# Patient Record
Sex: Male | Born: 1950 | Race: White | Hispanic: No | Marital: Married
Health system: Southern US, Community
[De-identification: ages and names within clinical notes are randomized; demographics above are authoritative.]

## PROBLEM LIST (undated history)

## (undated) DIAGNOSIS — J9621 Acute and chronic respiratory failure with hypoxia: Secondary | ICD-10-CM

## (undated) DIAGNOSIS — G5600 Carpal tunnel syndrome, unspecified upper limb: Secondary | ICD-10-CM

## (undated) DIAGNOSIS — F32A Depression, unspecified: Secondary | ICD-10-CM

## (undated) DIAGNOSIS — G40909 Epilepsy, unspecified, not intractable, without status epilepticus: Secondary | ICD-10-CM

## (undated) DIAGNOSIS — H25013 Cortical age-related cataract, bilateral: Secondary | ICD-10-CM

## (undated) DIAGNOSIS — I5022 Chronic systolic (congestive) heart failure: Secondary | ICD-10-CM

## (undated) DIAGNOSIS — E785 Hyperlipidemia, unspecified: Secondary | ICD-10-CM

## (undated) DIAGNOSIS — E039 Hypothyroidism, unspecified: Secondary | ICD-10-CM

## (undated) DIAGNOSIS — M1990 Unspecified osteoarthritis, unspecified site: Secondary | ICD-10-CM

## (undated) DIAGNOSIS — M109 Gout, unspecified: Secondary | ICD-10-CM

## (undated) DIAGNOSIS — E119 Type 2 diabetes mellitus without complications: Secondary | ICD-10-CM

## (undated) DIAGNOSIS — U071 COVID-19: Secondary | ICD-10-CM

## (undated) DIAGNOSIS — F329 Major depressive disorder, single episode, unspecified: Secondary | ICD-10-CM

## (undated) DIAGNOSIS — E1142 Type 2 diabetes mellitus with diabetic polyneuropathy: Secondary | ICD-10-CM

## (undated) DIAGNOSIS — E669 Obesity, unspecified: Secondary | ICD-10-CM

## (undated) DIAGNOSIS — I6389 Other cerebral infarction: Secondary | ICD-10-CM

## (undated) DIAGNOSIS — I219 Acute myocardial infarction, unspecified: Secondary | ICD-10-CM

## (undated) DIAGNOSIS — M722 Plantar fascial fibromatosis: Secondary | ICD-10-CM

## (undated) HISTORY — PX: CORONARY ARTERY BYPASS GRAFT: SHX141

## (undated) HISTORY — PX: COLON SURGERY: SHX602

---

## 2017-08-18 ENCOUNTER — Other Ambulatory Visit (HOSPITAL_COMMUNITY): Payer: Self-pay

## 2017-08-18 ENCOUNTER — Inpatient Hospital Stay
Admission: AD | Admit: 2017-08-18 | Discharge: 2017-09-25 | Disposition: A | Discharge status: Skilled Nursing Facility | Payer: Self-pay | Source: Ambulatory Visit | Attending: Internal Medicine | Admitting: Internal Medicine

## 2017-08-18 DIAGNOSIS — J189 Pneumonia, unspecified organism: Secondary | ICD-10-CM

## 2017-08-18 DIAGNOSIS — J9690 Respiratory failure, unspecified, unspecified whether with hypoxia or hypercapnia: Secondary | ICD-10-CM

## 2017-08-18 DIAGNOSIS — R509 Fever, unspecified: Secondary | ICD-10-CM

## 2017-08-18 DIAGNOSIS — Z431 Encounter for attention to gastrostomy: Secondary | ICD-10-CM

## 2017-08-18 DIAGNOSIS — Z4659 Encounter for fitting and adjustment of other gastrointestinal appliance and device: Secondary | ICD-10-CM

## 2017-08-18 HISTORY — DX: Carpal tunnel syndrome, unspecified upper limb: G56.00

## 2017-08-18 HISTORY — DX: Plantar fascial fibromatosis: M72.2

## 2017-08-18 HISTORY — DX: Gout, unspecified: M10.9

## 2017-08-18 HISTORY — DX: Hypothyroidism, unspecified: E03.9

## 2017-08-18 HISTORY — DX: Cortical age-related cataract, bilateral: H25.013

## 2017-08-18 HISTORY — DX: Unspecified osteoarthritis, unspecified site: M19.90

## 2017-08-18 HISTORY — DX: Type 2 diabetes mellitus without complications: E11.9

## 2017-08-18 HISTORY — DX: Type 2 diabetes mellitus with diabetic polyneuropathy: E11.42

## 2017-08-18 HISTORY — DX: Major depressive disorder, single episode, unspecified: F32.9

## 2017-08-18 HISTORY — DX: Depression, unspecified: F32.A

## 2017-08-18 HISTORY — DX: Obesity, unspecified: E66.9

## 2017-08-18 HISTORY — DX: Hyperlipidemia, unspecified: E78.5

## 2017-08-18 LAB — PROTIME-INR
INR: 2.77
Prothrombin Time: 29.1 seconds — ABNORMAL HIGH (ref 11.4–15.2)

## 2017-08-19 LAB — COMPREHENSIVE METABOLIC PANEL
ALK PHOS: 102 U/L (ref 38–126)
ALT: 24 U/L (ref 17–63)
AST: 32 U/L (ref 15–41)
Albumin: 1.4 g/dL — ABNORMAL LOW (ref 3.5–5.0)
Anion gap: 7 (ref 5–15)
BUN: 38 mg/dL — ABNORMAL HIGH (ref 6–20)
CALCIUM: 7.6 mg/dL — AB (ref 8.9–10.3)
CO2: 26 mmol/L (ref 22–32)
CREATININE: 1.03 mg/dL (ref 0.61–1.24)
Chloride: 111 mmol/L (ref 101–111)
GFR calc non Af Amer: >60 mL/min (ref >60)
GLUCOSE: 142 mg/dL — AB (ref 65–99)
Potassium: 3.8 mmol/L (ref 3.5–5.1)
SODIUM: 144 mmol/L (ref 135–145)
Total Bilirubin: 1.1 mg/dL (ref 0.3–1.2)
Total Protein: 5.3 g/dL — ABNORMAL LOW (ref 6.5–8.1)

## 2017-08-19 LAB — CBC WITH DIFFERENTIAL/PLATELET
BASOS PCT: 0 %
Basophils Absolute: 0 10*3/uL (ref 0.0–0.1)
EOS ABS: 0.1 10*3/uL (ref 0.0–0.7)
Eosinophils Relative: 1 %
HCT: 27.5 % — ABNORMAL LOW (ref 39.0–52.0)
Hemoglobin: 8.3 g/dL — ABNORMAL LOW (ref 13.0–17.0)
Lymphocytes Relative: 23 %
Lymphs Abs: 1.6 10*3/uL (ref 0.7–4.0)
MCH: 28.2 pg (ref 26.0–34.0)
MCHC: 30.2 g/dL (ref 30.0–36.0)
MCV: 93.5 fL (ref 78.0–100.0)
MONO ABS: 0.5 10*3/uL (ref 0.1–1.0)
MONOS PCT: 8 %
NEUTROS PCT: 68 %
Neutro Abs: 4.7 10*3/uL (ref 1.7–7.7)
PLATELETS: 324 10*3/uL (ref 150–400)
RBC: 2.94 MIL/uL — ABNORMAL LOW (ref 4.22–5.81)
RDW: 18.4 % — ABNORMAL HIGH (ref 11.5–15.5)
WBC: 7 10*3/uL (ref 4.0–10.5)

## 2017-08-19 LAB — PROTIME-INR
INR: 3.35
PROTHROMBIN TIME: 33.7 s — AB (ref 11.4–15.2)

## 2017-08-19 LAB — HEMOGLOBIN A1C
Hgb A1c MFr Bld: 5.5 % (ref 4.8–5.6)
Mean Plasma Glucose: 111.15 mg/dL

## 2017-08-20 ENCOUNTER — Other Ambulatory Visit (HOSPITAL_COMMUNITY): Payer: Self-pay

## 2017-08-20 LAB — URINALYSIS, ROUTINE W REFLEX MICROSCOPIC
BILIRUBIN URINE: NEGATIVE
Glucose, UA: NEGATIVE mg/dL
KETONES UR: NEGATIVE mg/dL
Leukocytes, UA: NEGATIVE
Nitrite: NEGATIVE
Protein, ur: 30 mg/dL — AB
Specific Gravity, Urine: 1.02 (ref 1.005–1.030)
pH: 5 (ref 5.0–8.0)

## 2017-08-20 LAB — PROTIME-INR
INR: 2.39
Prothrombin Time: 25.9 seconds — ABNORMAL HIGH (ref 11.4–15.2)

## 2017-08-21 LAB — CBC
HEMATOCRIT: 34.9 % — AB (ref 39.0–52.0)
HEMOGLOBIN: 10.1 g/dL — AB (ref 13.0–17.0)
MCH: 28.4 pg (ref 26.0–34.0)
MCHC: 28.9 g/dL — ABNORMAL LOW (ref 30.0–36.0)
MCV: 98 fL (ref 78.0–100.0)
Platelets: 275 10*3/uL (ref 150–400)
RBC: 3.56 MIL/uL — AB (ref 4.22–5.81)
RDW: 16.8 % — ABNORMAL HIGH (ref 11.5–15.5)
WBC: 17.1 10*3/uL — ABNORMAL HIGH (ref 4.0–10.5)

## 2017-08-21 LAB — BASIC METABOLIC PANEL
Anion gap: 9 (ref 5–15)
BUN: 43 mg/dL — AB (ref 6–20)
CHLORIDE: 105 mmol/L (ref 101–111)
CO2: 31 mmol/L (ref 22–32)
Calcium: 8 mg/dL — ABNORMAL LOW (ref 8.9–10.3)
Creatinine, Ser: 0.93 mg/dL (ref 0.61–1.24)
GFR calc Af Amer: >60 mL/min (ref >60)
GFR calc non Af Amer: >60 mL/min (ref >60)
GLUCOSE: 106 mg/dL — AB (ref 65–99)
POTASSIUM: 4.4 mmol/L (ref 3.5–5.1)
SODIUM: 145 mmol/L (ref 135–145)

## 2017-08-21 LAB — BLOOD CULTURE ID PANEL (REFLEXED)
Acinetobacter baumannii: NOT DETECTED
CANDIDA KRUSEI: NOT DETECTED
CANDIDA TROPICALIS: NOT DETECTED
Candida albicans: NOT DETECTED
Candida glabrata: NOT DETECTED
Candida parapsilosis: NOT DETECTED
ENTEROCOCCUS SPECIES: NOT DETECTED
ESCHERICHIA COLI: NOT DETECTED
Enterobacter cloacae complex: NOT DETECTED
Enterobacteriaceae species: NOT DETECTED
Haemophilus influenzae: NOT DETECTED
Klebsiella oxytoca: NOT DETECTED
Klebsiella pneumoniae: NOT DETECTED
LISTERIA MONOCYTOGENES: NOT DETECTED
Methicillin resistance: DETECTED — AB
Neisseria meningitidis: NOT DETECTED
PROTEUS SPECIES: NOT DETECTED
Pseudomonas aeruginosa: NOT DETECTED
SERRATIA MARCESCENS: NOT DETECTED
STAPHYLOCOCCUS AUREUS BCID: NOT DETECTED
STAPHYLOCOCCUS SPECIES: DETECTED — AB
STREPTOCOCCUS AGALACTIAE: NOT DETECTED
Streptococcus pneumoniae: NOT DETECTED
Streptococcus pyogenes: NOT DETECTED
Streptococcus species: NOT DETECTED

## 2017-08-21 LAB — URINE CULTURE: Culture: >=100000 — AB

## 2017-08-21 LAB — PROTIME-INR
INR: 1.25
INR: 2.19
Prothrombin Time: 15.6 seconds — ABNORMAL HIGH (ref 11.4–15.2)
Prothrombin Time: 24.2 seconds — ABNORMAL HIGH (ref 11.4–15.2)

## 2017-08-22 LAB — VANCOMYCIN, TROUGH: Vancomycin Tr: 37 ug/mL (ref 15–20)

## 2017-08-22 LAB — CULTURE, BLOOD (ROUTINE X 2)

## 2017-08-22 LAB — PROTIME-INR
INR: 2.2
PROTHROMBIN TIME: 24.3 s — AB (ref 11.4–15.2)

## 2017-08-23 LAB — VANCOMYCIN, TROUGH: VANCOMYCIN TR: 20 ug/mL (ref 15–20)

## 2017-08-23 LAB — PROTIME-INR
INR: 2.22
Prothrombin Time: 24.4 seconds — ABNORMAL HIGH (ref 11.4–15.2)

## 2017-08-24 LAB — PROTIME-INR
INR: 2.44
Prothrombin Time: 26.3 seconds — ABNORMAL HIGH (ref 11.4–15.2)

## 2017-08-25 LAB — C DIFFICILE QUICK SCREEN W PCR REFLEX
C DIFFICLE (CDIFF) ANTIGEN: NEGATIVE
C Diff interpretation: NOT DETECTED
C Diff toxin: NEGATIVE

## 2017-08-25 LAB — PROTIME-INR
INR: 2.66
PROTHROMBIN TIME: 28.1 s — AB (ref 11.4–15.2)

## 2017-08-25 LAB — CULTURE, BLOOD (ROUTINE X 2)
Culture: NO GROWTH
Special Requests: ADEQUATE

## 2017-08-25 LAB — VANCOMYCIN, TROUGH: VANCOMYCIN TR: 40 ug/mL — AB (ref 15–20)

## 2017-08-26 LAB — VANCOMYCIN, TROUGH: VANCOMYCIN TR: 21 ug/mL — AB (ref 15–20)

## 2017-08-26 LAB — PROTIME-INR
INR: 2.86
Prothrombin Time: 29.8 seconds — ABNORMAL HIGH (ref 11.4–15.2)

## 2017-08-27 ENCOUNTER — Other Ambulatory Visit (HOSPITAL_COMMUNITY): Payer: Self-pay

## 2017-08-27 LAB — PROTIME-INR
INR: 2.67
PROTHROMBIN TIME: 28.2 s — AB (ref 11.4–15.2)

## 2017-08-28 LAB — VANCOMYCIN, TROUGH: Vancomycin Tr: 34 ug/mL (ref 15–20)

## 2017-08-28 LAB — PROTIME-INR
INR: 2.66
PROTHROMBIN TIME: 28.2 s — AB (ref 11.4–15.2)

## 2017-08-29 LAB — PROTIME-INR
INR: 3.11
PROTHROMBIN TIME: 31.8 s — AB (ref 11.4–15.2)

## 2017-08-30 LAB — PROTIME-INR
INR: 2.9
Prothrombin Time: 30.1 seconds — ABNORMAL HIGH (ref 11.4–15.2)

## 2017-08-31 LAB — CBC
HCT: 26.9 % — ABNORMAL LOW (ref 39.0–52.0)
Hemoglobin: 7.2 g/dL — ABNORMAL LOW (ref 13.0–17.0)
MCH: 25.6 pg — ABNORMAL LOW (ref 26.0–34.0)
MCHC: 26.8 g/dL — AB (ref 30.0–36.0)
MCV: 95.7 fL (ref 78.0–100.0)
Platelets: 557 10*3/uL — ABNORMAL HIGH (ref 150–400)
RBC: 2.81 MIL/uL — ABNORMAL LOW (ref 4.22–5.81)
RDW: 18.1 % — AB (ref 11.5–15.5)
WBC: 8.9 10*3/uL (ref 4.0–10.5)

## 2017-08-31 LAB — BASIC METABOLIC PANEL
ANION GAP: 7 (ref 5–15)
BUN: 30 mg/dL — AB (ref 6–20)
CALCIUM: 7.9 mg/dL — AB (ref 8.9–10.3)
CO2: 27 mmol/L (ref 22–32)
Chloride: 125 mmol/L — ABNORMAL HIGH (ref 101–111)
Creatinine, Ser: 0.98 mg/dL (ref 0.61–1.24)
GFR calc Af Amer: >60 mL/min (ref >60)
GLUCOSE: 109 mg/dL — AB (ref 65–99)
Potassium: 3.7 mmol/L (ref 3.5–5.1)
SODIUM: 159 mmol/L — AB (ref 135–145)

## 2017-08-31 LAB — PROTIME-INR
INR: 2.58
PROTHROMBIN TIME: 27.4 s — AB (ref 11.4–15.2)

## 2017-09-01 LAB — BASIC METABOLIC PANEL
Anion gap: 5 (ref 5–15)
BUN: 26 mg/dL — AB (ref 6–20)
CHLORIDE: 124 mmol/L — AB (ref 101–111)
CO2: 27 mmol/L (ref 22–32)
Calcium: 7.8 mg/dL — ABNORMAL LOW (ref 8.9–10.3)
Creatinine, Ser: 0.9 mg/dL (ref 0.61–1.24)
GFR calc non Af Amer: >60 mL/min (ref >60)
Glucose, Bld: 174 mg/dL — ABNORMAL HIGH (ref 65–99)
POTASSIUM: 3.9 mmol/L (ref 3.5–5.1)
SODIUM: 156 mmol/L — AB (ref 135–145)

## 2017-09-01 LAB — PROTIME-INR
INR: 2.36
Prothrombin Time: 25.6 seconds — ABNORMAL HIGH (ref 11.4–15.2)

## 2017-09-02 LAB — PROTIME-INR
INR: 2.75
Prothrombin Time: 28.9 seconds — ABNORMAL HIGH (ref 11.4–15.2)

## 2017-09-03 LAB — PROTIME-INR
INR: 2.88
Prothrombin Time: 29.9 seconds — ABNORMAL HIGH (ref 11.4–15.2)

## 2017-09-04 LAB — URINALYSIS, ROUTINE W REFLEX MICROSCOPIC
Bilirubin Urine: NEGATIVE
GLUCOSE, UA: NEGATIVE mg/dL
Hgb urine dipstick: NEGATIVE
Ketones, ur: NEGATIVE mg/dL
LEUKOCYTES UA: NEGATIVE
Nitrite: NEGATIVE
PH: 5 (ref 5.0–8.0)
Protein, ur: NEGATIVE mg/dL
Specific Gravity, Urine: 1.017 (ref 1.005–1.030)

## 2017-09-04 LAB — BASIC METABOLIC PANEL
ANION GAP: 8 (ref 5–15)
BUN: 23 mg/dL — ABNORMAL HIGH (ref 6–20)
CALCIUM: 8 mg/dL — AB (ref 8.9–10.3)
CO2: 27 mmol/L (ref 22–32)
Chloride: 110 mmol/L (ref 101–111)
Creatinine, Ser: 0.85 mg/dL (ref 0.61–1.24)
GFR calc Af Amer: >60 mL/min (ref >60)
GFR calc non Af Amer: >60 mL/min (ref >60)
GLUCOSE: 134 mg/dL — AB (ref 65–99)
POTASSIUM: 3.6 mmol/L (ref 3.5–5.1)
Sodium: 145 mmol/L (ref 135–145)

## 2017-09-04 LAB — PROTIME-INR
INR: 2.39
PROTHROMBIN TIME: 25.9 s — AB (ref 11.4–15.2)

## 2017-09-05 LAB — PROTIME-INR
INR: 2.32
Prothrombin Time: 25.3 seconds — ABNORMAL HIGH (ref 11.4–15.2)

## 2017-09-05 LAB — URINE CULTURE
CULTURE: NO GROWTH
Special Requests: NORMAL

## 2017-09-06 LAB — PROTIME-INR
INR: 2.14
Prothrombin Time: 23.7 seconds — ABNORMAL HIGH (ref 11.4–15.2)

## 2017-09-07 ENCOUNTER — Other Ambulatory Visit (HOSPITAL_COMMUNITY): Payer: Self-pay

## 2017-09-07 LAB — CULTURE, RESPIRATORY: CULTURE: NORMAL

## 2017-09-07 LAB — PROTIME-INR
INR: 2.12
PROTHROMBIN TIME: 23.6 s — AB (ref 11.4–15.2)

## 2017-09-07 LAB — CULTURE, RESPIRATORY W GRAM STAIN

## 2017-09-08 LAB — BASIC METABOLIC PANEL
Anion gap: 5 (ref 5–15)
BUN: 17 mg/dL (ref 6–20)
CHLORIDE: 105 mmol/L (ref 101–111)
CO2: 29 mmol/L (ref 22–32)
CREATININE: 0.72 mg/dL (ref 0.61–1.24)
Calcium: 8.3 mg/dL — ABNORMAL LOW (ref 8.9–10.3)
GLUCOSE: 179 mg/dL — AB (ref 65–99)
Potassium: 3.7 mmol/L (ref 3.5–5.1)
Sodium: 139 mmol/L (ref 135–145)

## 2017-09-08 LAB — CBC
HEMATOCRIT: 27.1 % — AB (ref 39.0–52.0)
HEMOGLOBIN: 7.4 g/dL — AB (ref 13.0–17.0)
MCH: 25 pg — AB (ref 26.0–34.0)
MCHC: 27.3 g/dL — AB (ref 30.0–36.0)
MCV: 91.6 fL (ref 78.0–100.0)
Platelets: 595 10*3/uL — ABNORMAL HIGH (ref 150–400)
RBC: 2.96 MIL/uL — ABNORMAL LOW (ref 4.22–5.81)
RDW: 17.8 % — AB (ref 11.5–15.5)
WBC: 13.2 10*3/uL — ABNORMAL HIGH (ref 4.0–10.5)

## 2017-09-08 LAB — PROTIME-INR
INR: 1.89
Prothrombin Time: 21.5 seconds — ABNORMAL HIGH (ref 11.4–15.2)

## 2017-09-09 LAB — CULTURE, BLOOD (ROUTINE X 2)
Culture: NO GROWTH
Culture: NO GROWTH
Special Requests: ADEQUATE
Special Requests: ADEQUATE

## 2017-09-09 LAB — PROTIME-INR
INR: 1.97
Prothrombin Time: 22.2 seconds — ABNORMAL HIGH (ref 11.4–15.2)

## 2017-09-10 ENCOUNTER — Other Ambulatory Visit (HOSPITAL_COMMUNITY): Payer: Self-pay

## 2017-09-10 LAB — BASIC METABOLIC PANEL
ANION GAP: 6 (ref 5–15)
BUN: 14 mg/dL (ref 6–20)
CALCIUM: 8 mg/dL — AB (ref 8.9–10.3)
CO2: 26 mmol/L (ref 22–32)
CREATININE: 0.74 mg/dL (ref 0.61–1.24)
Chloride: 103 mmol/L (ref 101–111)
Glucose, Bld: 225 mg/dL — ABNORMAL HIGH (ref 65–99)
Potassium: 4.7 mmol/L (ref 3.5–5.1)
Sodium: 135 mmol/L (ref 135–145)

## 2017-09-10 LAB — CBC
HCT: 29.1 % — ABNORMAL LOW (ref 39.0–52.0)
HEMOGLOBIN: 7.9 g/dL — AB (ref 13.0–17.0)
MCH: 25.6 pg — AB (ref 26.0–34.0)
MCHC: 27.1 g/dL — AB (ref 30.0–36.0)
MCV: 94.2 fL (ref 78.0–100.0)
PLATELETS: 487 10*3/uL — AB (ref 150–400)
RBC: 3.09 MIL/uL — AB (ref 4.22–5.81)
RDW: 18.2 % — AB (ref 11.5–15.5)
WBC: 9.4 10*3/uL (ref 4.0–10.5)

## 2017-09-11 LAB — BLOOD GAS, ARTERIAL
ACID-BASE EXCESS: 2.4 mmol/L — AB (ref 0.0–2.0)
Bicarbonate: 29.6 mmol/L — ABNORMAL HIGH (ref 20.0–28.0)
O2 Content: 2 L/min
O2 Saturation: 82.1 %
PCO2 ART: 77.2 mmHg — AB (ref 32.0–48.0)
PH ART: 7.209 — AB (ref 7.350–7.450)
PO2 ART: 56.1 mmHg — AB (ref 83.0–108.0)
Patient temperature: 98.6

## 2017-09-11 LAB — PROTIME-INR
INR: 1.78
PROTHROMBIN TIME: 20.6 s — AB (ref 11.4–15.2)

## 2017-09-12 LAB — PROTIME-INR
INR: 1.77
PROTHROMBIN TIME: 20.5 s — AB (ref 11.4–15.2)

## 2017-09-13 LAB — PROTIME-INR
INR: 1.74
Prothrombin Time: 20.2 seconds — ABNORMAL HIGH (ref 11.4–15.2)

## 2017-09-14 LAB — BASIC METABOLIC PANEL
Anion gap: 11 (ref 5–15)
BUN: 34 mg/dL — AB (ref 6–20)
CO2: 34 mmol/L — AB (ref 22–32)
Calcium: 8 mg/dL — ABNORMAL LOW (ref 8.9–10.3)
Chloride: 99 mmol/L — ABNORMAL LOW (ref 101–111)
Creatinine, Ser: 0.78 mg/dL (ref 0.61–1.24)
GFR calc non Af Amer: >60 mL/min (ref >60)
Glucose, Bld: 60 mg/dL — ABNORMAL LOW (ref 65–99)
POTASSIUM: 3.6 mmol/L (ref 3.5–5.1)
SODIUM: 144 mmol/L (ref 135–145)

## 2017-09-14 LAB — PROTIME-INR
INR: 1.87
PROTHROMBIN TIME: 21.3 s — AB (ref 11.4–15.2)

## 2017-09-14 LAB — CBC
HEMATOCRIT: 32.9 % — AB (ref 39.0–52.0)
Hemoglobin: 8.9 g/dL — ABNORMAL LOW (ref 13.0–17.0)
MCH: 25.4 pg — ABNORMAL LOW (ref 26.0–34.0)
MCHC: 27.1 g/dL — AB (ref 30.0–36.0)
MCV: 94 fL (ref 78.0–100.0)
PLATELETS: 519 10*3/uL — AB (ref 150–400)
RBC: 3.5 MIL/uL — ABNORMAL LOW (ref 4.22–5.81)
RDW: 20.5 % — AB (ref 11.5–15.5)
WBC: 9.9 10*3/uL (ref 4.0–10.5)

## 2017-09-14 LAB — MAGNESIUM: MAGNESIUM: 2 mg/dL (ref 1.7–2.4)

## 2017-09-15 LAB — PROTIME-INR
INR: 2.13
PROTHROMBIN TIME: 23.7 s — AB (ref 11.4–15.2)

## 2017-09-16 LAB — PROTIME-INR
INR: 2.3
Prothrombin Time: 25.1 seconds — ABNORMAL HIGH (ref 11.4–15.2)

## 2017-09-17 LAB — PROTIME-INR
INR: 2.06
PROTHROMBIN TIME: 23.1 s — AB (ref 11.4–15.2)

## 2017-09-18 LAB — PROTIME-INR
INR: 2.63
Prothrombin Time: 27.9 seconds — ABNORMAL HIGH (ref 11.4–15.2)

## 2017-09-19 LAB — PROTIME-INR
INR: 2.72
Prothrombin Time: 28.7 seconds — ABNORMAL HIGH (ref 11.4–15.2)

## 2017-09-20 LAB — PROTIME-INR
INR: 2.8
PROTHROMBIN TIME: 29.3 s — AB (ref 11.4–15.2)

## 2017-09-21 ENCOUNTER — Encounter: Payer: Self-pay | Admitting: Physical Medicine and Rehabilitation

## 2017-09-21 LAB — PROTIME-INR
INR: 2.21
Prothrombin Time: 24.3 seconds — ABNORMAL HIGH (ref 11.4–15.2)

## 2017-09-22 LAB — PROTIME-INR
INR: 1.81
Prothrombin Time: 20.9 seconds — ABNORMAL HIGH (ref 11.4–15.2)

## 2017-09-23 LAB — PROTIME-INR
INR: 1.82
Prothrombin Time: 20.9 seconds — ABNORMAL HIGH (ref 11.4–15.2)

## 2017-09-25 LAB — BASIC METABOLIC PANEL
ANION GAP: 9 (ref 5–15)
BUN: 19 mg/dL (ref 6–20)
CALCIUM: 7.8 mg/dL — AB (ref 8.9–10.3)
CO2: 32 mmol/L (ref 22–32)
Chloride: 97 mmol/L — ABNORMAL LOW (ref 101–111)
Creatinine, Ser: 0.84 mg/dL (ref 0.61–1.24)
Glucose, Bld: 257 mg/dL — ABNORMAL HIGH (ref 65–99)
Potassium: 3.5 mmol/L (ref 3.5–5.1)
Sodium: 138 mmol/L (ref 135–145)

## 2017-09-25 LAB — PROTIME-INR
INR: 1.93
PROTHROMBIN TIME: 21.9 s — AB (ref 11.4–15.2)

## 2017-09-25 LAB — CBC
HCT: 32.8 % — ABNORMAL LOW (ref 39.0–52.0)
HEMOGLOBIN: 9.4 g/dL — AB (ref 13.0–17.0)
MCH: 26.7 pg (ref 26.0–34.0)
MCHC: 28.7 g/dL — ABNORMAL LOW (ref 30.0–36.0)
MCV: 93.2 fL (ref 78.0–100.0)
PLATELETS: 241 10*3/uL (ref 150–400)
RBC: 3.52 MIL/uL — AB (ref 4.22–5.81)
RDW: 20.6 % — ABNORMAL HIGH (ref 11.5–15.5)
WBC: 9.4 10*3/uL (ref 4.0–10.5)

## 2018-04-10 ENCOUNTER — Encounter (HOSPITAL_BASED_OUTPATIENT_CLINIC_OR_DEPARTMENT_OTHER): Payer: Self-pay

## 2018-04-10 ENCOUNTER — Emergency Department (HOSPITAL_BASED_OUTPATIENT_CLINIC_OR_DEPARTMENT_OTHER): Payer: Medicare HMO

## 2018-04-10 ENCOUNTER — Other Ambulatory Visit: Payer: Self-pay

## 2018-04-10 ENCOUNTER — Emergency Department (HOSPITAL_BASED_OUTPATIENT_CLINIC_OR_DEPARTMENT_OTHER)
Admission: EM | Admit: 2018-04-10 | Discharge: 2018-04-10 | Disposition: A | Discharge status: Home / Self Care | Payer: Medicare HMO | Attending: Emergency Medicine | Admitting: Emergency Medicine

## 2018-04-10 DIAGNOSIS — M545 Low back pain, unspecified: Secondary | ICD-10-CM

## 2018-04-10 DIAGNOSIS — Y92410 Unspecified street and highway as the place of occurrence of the external cause: Secondary | ICD-10-CM | POA: Diagnosis not present

## 2018-04-10 DIAGNOSIS — Y998 Other external cause status: Secondary | ICD-10-CM | POA: Insufficient documentation

## 2018-04-10 DIAGNOSIS — Z7902 Long term (current) use of antithrombotics/antiplatelets: Secondary | ICD-10-CM | POA: Insufficient documentation

## 2018-04-10 DIAGNOSIS — I252 Old myocardial infarction: Secondary | ICD-10-CM | POA: Insufficient documentation

## 2018-04-10 DIAGNOSIS — E119 Type 2 diabetes mellitus without complications: Secondary | ICD-10-CM | POA: Diagnosis not present

## 2018-04-10 DIAGNOSIS — Z79899 Other long term (current) drug therapy: Secondary | ICD-10-CM | POA: Insufficient documentation

## 2018-04-10 DIAGNOSIS — F329 Major depressive disorder, single episode, unspecified: Secondary | ICD-10-CM | POA: Diagnosis not present

## 2018-04-10 DIAGNOSIS — E039 Hypothyroidism, unspecified: Secondary | ICD-10-CM | POA: Diagnosis not present

## 2018-04-10 DIAGNOSIS — Z951 Presence of aortocoronary bypass graft: Secondary | ICD-10-CM | POA: Insufficient documentation

## 2018-04-10 DIAGNOSIS — S51811A Laceration without foreign body of right forearm, initial encounter: Secondary | ICD-10-CM

## 2018-04-10 DIAGNOSIS — Z7984 Long term (current) use of oral hypoglycemic drugs: Secondary | ICD-10-CM | POA: Diagnosis not present

## 2018-04-10 DIAGNOSIS — S59911A Unspecified injury of right forearm, initial encounter: Secondary | ICD-10-CM | POA: Diagnosis present

## 2018-04-10 DIAGNOSIS — Y9389 Activity, other specified: Secondary | ICD-10-CM | POA: Insufficient documentation

## 2018-04-10 HISTORY — DX: Acute myocardial infarction, unspecified: I21.9

## 2018-04-10 LAB — CBG MONITORING, ED: GLUCOSE-CAPILLARY: 146 mg/dL — AB (ref 65–99)

## 2018-04-10 MED ORDER — LIDOCAINE 5 % EX PTCH: 1.0000 | MEDICATED_PATCH | CUTANEOUS | 0 refills | Status: AC

## 2018-04-10 MED ORDER — METHOCARBAMOL 500 MG PO TABS: 500.0000 mg | ORAL_TABLET | Freq: Two times a day (BID) | ORAL | 0 refills | Status: DC

## 2018-04-10 NOTE — ED Notes (Signed)
ED Provider at bedside. 
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
## 2018-04-10 NOTE — ED Notes (Signed)
Pt denies LOC/hitting his head. Pt on blood thinner.

## 2018-04-10 NOTE — Discharge Instructions (Addendum)
Expect your soreness to increase over the next 2-3 days. Take it easy, but do not lay around too much as this may make any stiffness worse.  Tylenol: May take Tylenol for pain.  Your daily total maximum amount of tylenol from all sources should be limited to /day for persons without liver problems, or /day for those with liver problems. Lidocaine patches: These are available via either prescription or over-the-counter. The over-the-counter option may be more economical one and are likely just as effective. There are multiple over-the-counter brands, such as Salonpas. Exercises: Be sure to perform the attached exercises starting with three times a week and working up to performing them daily. This is an essential part of preventing long term problems.   Follow up with a primary care provider for any future management of these complaints.   Wound Care You may remove the bandage after 24 hours. Clean the wound and surrounding area gently with tap water and mild soap. Rinse well and blot dry. Do not scrub the wound. You may shower, but avoid submerging the wound, such as with a bath or swimming. Clean the wound daily to prevent infection. Do not use cleaners such as hydrogen peroxide or alcohol.   Scar reduction: Application of a topical antibiotic ointment, such as Neosporin, after the wound has begun to close and heal well can decrease scab formation and reduce scarring. After the wound has healed, application of ointments such as Aquaphor can also reduce scar formation.  The key to scar reduction is keeping the skin well hydrated and supple. Drinking plenty of water throughout the day (At least eight 8oz glasses of water a day) is essential to staying well hydrated.  Sun exposure: Keep the wound out of the sun. After the wound has healed, continue to protect it from the sun by wearing protective clothing or applying sunscreen.  Pain: You may use Tylenol for pain.  Return to the ED  sooner should signs of infection arise, such as spreading redness, puffiness/swelling, pus draining from the wound, severe increase in pain, fever over 100.61F, or any other major issues.
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
## 2020-02-08 ENCOUNTER — Inpatient Hospital Stay
Admission: RE | Admit: 2020-02-08 | Discharge: 2020-03-21 | Disposition: E | Payer: Medicare HMO | Source: Other Acute Inpatient Hospital | Attending: Internal Medicine | Admitting: Internal Medicine

## 2020-02-08 DIAGNOSIS — I6389 Other cerebral infarction: Secondary | ICD-10-CM | POA: Diagnosis present

## 2020-02-08 DIAGNOSIS — J9690 Respiratory failure, unspecified, unspecified whether with hypoxia or hypercapnia: Secondary | ICD-10-CM

## 2020-02-08 DIAGNOSIS — Z452 Encounter for adjustment and management of vascular access device: Secondary | ICD-10-CM

## 2020-02-08 DIAGNOSIS — I5022 Chronic systolic (congestive) heart failure: Secondary | ICD-10-CM | POA: Diagnosis present

## 2020-02-08 DIAGNOSIS — Z931 Gastrostomy status: Secondary | ICD-10-CM

## 2020-02-08 DIAGNOSIS — J9621 Acute and chronic respiratory failure with hypoxia: Secondary | ICD-10-CM | POA: Diagnosis present

## 2020-02-08 DIAGNOSIS — G40909 Epilepsy, unspecified, not intractable, without status epilepticus: Secondary | ICD-10-CM

## 2020-02-08 DIAGNOSIS — U071 COVID-19: Secondary | ICD-10-CM | POA: Diagnosis present

## 2020-02-08 HISTORY — DX: Other cerebral infarction: I63.89

## 2020-02-08 HISTORY — DX: COVID-19: U07.1

## 2020-02-08 HISTORY — DX: Epilepsy, unspecified, not intractable, without status epilepticus: G40.909

## 2020-02-08 HISTORY — DX: Acute and chronic respiratory failure with hypoxia: J96.21

## 2020-02-08 HISTORY — DX: Chronic systolic (congestive) heart failure: I50.22

## 2020-02-08 LAB — BLOOD GAS, ARTERIAL
Acid-Base Excess: 12.3 mmol/L — ABNORMAL HIGH (ref 0.0–2.0)
Bicarbonate: 37.3 mmol/L — ABNORMAL HIGH (ref 20.0–28.0)
Drawn by: 243969
FIO2: 28
O2 Saturation: 89 %
Patient temperature: 37
pCO2 arterial: 58.5 mmHg — ABNORMAL HIGH (ref 32.0–48.0)
pH, Arterial: 7.421 (ref 7.350–7.450)
pO2, Arterial: 59.7 mmHg — ABNORMAL LOW (ref 83.0–108.0)

## 2020-02-09 ENCOUNTER — Other Ambulatory Visit (HOSPITAL_COMMUNITY): Payer: Medicare HMO

## 2020-02-09 LAB — COMPREHENSIVE METABOLIC PANEL
ALT: 35 U/L (ref 0–44)
AST: 51 U/L — ABNORMAL HIGH (ref 15–41)
Albumin: 2 g/dL — ABNORMAL LOW (ref 3.5–5.0)
Alkaline Phosphatase: 150 U/L — ABNORMAL HIGH (ref 38–126)
Anion gap: 13 (ref 5–15)
BUN: 21 mg/dL (ref 8–23)
CO2: 34 mmol/L — ABNORMAL HIGH (ref 22–32)
Calcium: 9.4 mg/dL (ref 8.9–10.3)
Chloride: 96 mmol/L — ABNORMAL LOW (ref 98–111)
Creatinine, Ser: 0.8 mg/dL (ref 0.61–1.24)
GFR calc Af Amer: >60 mL/min (ref >60)
GFR calc non Af Amer: >60 mL/min (ref >60)
Glucose, Bld: 143 mg/dL — ABNORMAL HIGH (ref 70–99)
Potassium: 5.3 mmol/L — ABNORMAL HIGH (ref 3.5–5.1)
Sodium: 143 mmol/L (ref 135–145)
Total Bilirubin: 1.3 mg/dL — ABNORMAL HIGH (ref 0.3–1.2)
Total Protein: 7.1 g/dL (ref 6.5–8.1)

## 2020-02-09 LAB — CBC WITH DIFFERENTIAL/PLATELET
Abs Immature Granulocytes: 0.03 10*3/uL (ref 0.00–0.07)
Basophils Absolute: 0.1 10*3/uL (ref 0.0–0.1)
Basophils Relative: 0 %
Eosinophils Absolute: 0.2 10*3/uL (ref 0.0–0.5)
Eosinophils Relative: 1 %
HCT: 34.4 % — ABNORMAL LOW (ref 39.0–52.0)
Hemoglobin: 10.7 g/dL — ABNORMAL LOW (ref 13.0–17.0)
Immature Granulocytes: 0 %
Lymphocytes Relative: 11 %
Lymphs Abs: 1.2 10*3/uL (ref 0.7–4.0)
MCH: 30.4 pg (ref 26.0–34.0)
MCHC: 31.1 g/dL (ref 30.0–36.0)
MCV: 97.7 fL (ref 80.0–100.0)
Monocytes Absolute: 1 10*3/uL (ref 0.1–1.0)
Monocytes Relative: 9 %
Neutro Abs: 8.7 10*3/uL — ABNORMAL HIGH (ref 1.7–7.7)
Neutrophils Relative %: 79 %
Platelets: 440 10*3/uL — ABNORMAL HIGH (ref 150–400)
RBC: 3.52 MIL/uL — ABNORMAL LOW (ref 4.22–5.81)
RDW: 16.9 % — ABNORMAL HIGH (ref 11.5–15.5)
WBC: 11.1 10*3/uL — ABNORMAL HIGH (ref 4.0–10.5)
nRBC: 0 % (ref 0.0–0.2)

## 2020-02-09 LAB — TSH: TSH: 5.186 u[IU]/mL — ABNORMAL HIGH (ref 0.350–4.500)

## 2020-02-09 MED ORDER — IOHEXOL 350 MG/ML SOLN
40.0000 mL | Freq: Once | INTRAVENOUS | Status: AC | PRN
  Administered 2020-02-09: 40 mL

## 2020-02-10 LAB — POTASSIUM: Potassium: 4.5 mmol/L (ref 3.5–5.1)

## 2020-02-10 LAB — VANCOMYCIN, TROUGH: Vancomycin Tr: 21 ug/mL (ref 15–20)

## 2020-02-10 MED ORDER — ATORVASTATIN CALCIUM 40 MG PO TABS
80.00 | ORAL_TABLET | ORAL | Status: DC
Start: 2020-02-09 — End: 2020-02-10

## 2020-02-10 MED ORDER — CHOLECALCIFEROL 25 MCG (1000 UT) PO TABS
2000.00 | ORAL_TABLET | ORAL | Status: DC
Start: 2020-02-09 — End: 2020-02-10

## 2020-02-10 MED ORDER — GENERIC EXTERNAL MEDICATION
Status: DC
Start: ? — End: 2020-02-10

## 2020-02-10 MED ORDER — INSULIN LISPRO 100 UNIT/ML ~~LOC~~ SOLN
0.00 | SUBCUTANEOUS | Status: DC
Start: 2020-02-08 — End: 2020-02-10

## 2020-02-10 MED ORDER — FAMOTIDINE 20 MG PO TABS
20.00 | ORAL_TABLET | ORAL | Status: DC
Start: 2020-02-08 — End: 2020-02-10

## 2020-02-10 MED ORDER — GLYCOPYRROLATE 1 MG PO TABS
1.00 | ORAL_TABLET | ORAL | Status: DC
Start: ? — End: 2020-02-10

## 2020-02-10 MED ORDER — LEVOTHYROXINE SODIUM 75 MCG PO TABS
75.00 | ORAL_TABLET | ORAL | Status: DC
Start: 2020-02-09 — End: 2020-02-10

## 2020-02-10 MED ORDER — THIAMINE HCL 100 MG PO TABS
100.00 | ORAL_TABLET | ORAL | Status: DC
Start: 2020-02-09 — End: 2020-02-10

## 2020-02-10 MED ORDER — GLUCOSE 40 % PO GEL
15.00 | ORAL | Status: DC
Start: ? — End: 2020-02-10

## 2020-02-10 MED ORDER — ACETAMINOPHEN 160 MG/5ML PO SOLN
500.00 | ORAL | Status: DC
Start: ? — End: 2020-02-10

## 2020-02-10 MED ORDER — NYSTATIN 100000 UNIT/GM EX POWD
CUTANEOUS | Status: DC
Start: 2020-02-08 — End: 2020-02-10

## 2020-02-10 MED ORDER — MAGNESIUM OXIDE 400 MG PO TABS
400.00 | ORAL_TABLET | ORAL | Status: DC
Start: 2020-02-08 — End: 2020-02-10

## 2020-02-10 MED ORDER — LEVETIRACETAM 100 MG/ML PO SOLN
500.00 | ORAL | Status: DC
Start: 2020-02-08 — End: 2020-02-10

## 2020-02-10 MED ORDER — PRO-STAT PO LIQD
ORAL | Status: DC
Start: 2020-02-09 — End: 2020-02-10

## 2020-02-10 MED ORDER — ALBUTEROL SULFATE HFA 108 (90 BASE) MCG/ACT IN AERS
2.00 | INHALATION_SPRAY | RESPIRATORY_TRACT | Status: DC
Start: ? — End: 2020-02-10

## 2020-02-10 MED ORDER — POLYVINYL ALCOHOL 1.4 % OP SOLN
1.00 | OPHTHALMIC | Status: DC
Start: 2020-02-08 — End: 2020-02-10

## 2020-02-10 MED ORDER — ENOXAPARIN SODIUM 40 MG/0.4ML ~~LOC~~ SOLN
0.50 | SUBCUTANEOUS | Status: DC
Start: 2020-02-08 — End: 2020-02-10

## 2020-02-10 MED ORDER — ASPIRIN 81 MG PO CHEW
81.00 | CHEWABLE_TABLET | ORAL | Status: DC
Start: 2020-02-09 — End: 2020-02-10

## 2020-02-10 MED ORDER — CYANOCOBALAMIN 100 MCG PO TABS
100.00 | ORAL_TABLET | ORAL | Status: DC
Start: 2020-02-09 — End: 2020-02-10

## 2020-02-10 MED ORDER — QUETIAPINE FUMARATE 25 MG PO TABS
12.50 | ORAL_TABLET | ORAL | Status: DC
Start: 2020-02-08 — End: 2020-02-10

## 2020-02-10 MED ORDER — METOCLOPRAMIDE HCL 5 MG/ML IJ SOLN
5.00 | INTRAMUSCULAR | Status: DC
Start: ? — End: 2020-02-10

## 2020-02-10 MED ORDER — GABAPENTIN 250 MG/5ML PO SOLN
300.00 | ORAL | Status: DC
Start: 2020-02-08 — End: 2020-02-10

## 2020-02-10 MED ORDER — FA-PYRIDOXINE-CYANOCOBALAMIN 2.5-25-2 MG PO TABS
1.00 | ORAL_TABLET | ORAL | Status: DC
Start: 2020-02-09 — End: 2020-02-10

## 2020-02-10 MED ORDER — DEXTROSE 10 % IV SOLN
125.00 | INTRAVENOUS | Status: DC
Start: ? — End: 2020-02-10

## 2020-02-10 MED ORDER — ACETAMINOPHEN 325 MG PO TABS
650.00 | ORAL_TABLET | ORAL | Status: DC
Start: ? — End: 2020-02-10

## 2020-02-10 MED ORDER — ALLOPURINOL 300 MG PO TABS
300.00 | ORAL_TABLET | ORAL | Status: DC
Start: 2020-02-08 — End: 2020-02-10

## 2020-02-10 MED ORDER — GLUCAGON (RDNA) 1 MG IJ KIT
1.00 | PACK | INTRAMUSCULAR | Status: DC
Start: ? — End: 2020-02-10

## 2020-02-11 LAB — COMPREHENSIVE METABOLIC PANEL
ALT: 37 U/L (ref 0–44)
AST: 46 U/L — ABNORMAL HIGH (ref 15–41)
Albumin: 1.9 g/dL — ABNORMAL LOW (ref 3.5–5.0)
Alkaline Phosphatase: 136 U/L — ABNORMAL HIGH (ref 38–126)
Anion gap: 8 (ref 5–15)
BUN: 49 mg/dL — ABNORMAL HIGH (ref 8–23)
CO2: 30 mmol/L (ref 22–32)
Calcium: 8.4 mg/dL — ABNORMAL LOW (ref 8.9–10.3)
Chloride: 102 mmol/L (ref 98–111)
Creatinine, Ser: 1 mg/dL (ref 0.61–1.24)
GFR calc Af Amer: >60 mL/min (ref >60)
GFR calc non Af Amer: >60 mL/min (ref >60)
Glucose, Bld: 321 mg/dL — ABNORMAL HIGH (ref 70–99)
Potassium: 4.5 mmol/L (ref 3.5–5.1)
Sodium: 140 mmol/L (ref 135–145)
Total Bilirubin: 1.5 mg/dL — ABNORMAL HIGH (ref 0.3–1.2)
Total Protein: 6.9 g/dL (ref 6.5–8.1)

## 2020-02-12 LAB — CBC
HCT: 35.3 % — ABNORMAL LOW (ref 39.0–52.0)
Hemoglobin: 10.8 g/dL — ABNORMAL LOW (ref 13.0–17.0)
MCH: 30.2 pg (ref 26.0–34.0)
MCHC: 30.6 g/dL (ref 30.0–36.0)
MCV: 98.6 fL (ref 80.0–100.0)
Platelets: 532 10*3/uL — ABNORMAL HIGH (ref 150–400)
RBC: 3.58 MIL/uL — ABNORMAL LOW (ref 4.22–5.81)
RDW: 17 % — ABNORMAL HIGH (ref 11.5–15.5)
WBC: 18.5 10*3/uL — ABNORMAL HIGH (ref 4.0–10.5)
nRBC: 0.1 % (ref 0.0–0.2)

## 2020-02-12 LAB — BASIC METABOLIC PANEL
Anion gap: 12 (ref 5–15)
BUN: 55 mg/dL — ABNORMAL HIGH (ref 8–23)
CO2: 29 mmol/L (ref 22–32)
Calcium: 8.6 mg/dL — ABNORMAL LOW (ref 8.9–10.3)
Chloride: 104 mmol/L (ref 98–111)
Creatinine, Ser: 0.93 mg/dL (ref 0.61–1.24)
GFR calc Af Amer: >60 mL/min (ref >60)
GFR calc non Af Amer: >60 mL/min (ref >60)
Glucose, Bld: 391 mg/dL — ABNORMAL HIGH (ref 70–99)
Potassium: 4.5 mmol/L (ref 3.5–5.1)
Sodium: 145 mmol/L (ref 135–145)

## 2020-02-12 LAB — CULTURE, BLOOD (ROUTINE X 2): Special Requests: ADEQUATE

## 2020-02-12 LAB — VANCOMYCIN, TROUGH: Vancomycin Tr: 24 ug/mL (ref 15–20)

## 2020-02-13 ENCOUNTER — Other Ambulatory Visit (HOSPITAL_COMMUNITY): Payer: Medicare HMO

## 2020-02-13 LAB — CULTURE, BLOOD (ROUTINE X 2): Special Requests: ADEQUATE

## 2020-02-13 LAB — VANCOMYCIN, TROUGH: Vancomycin Tr: 14 ug/mL — ABNORMAL LOW (ref 15–20)

## 2020-02-13 NOTE — Consult Note (Signed)
Infectious Disease Consultation   Sarp Vernier  CHY:[id number]  DOB: 1951/02/13  DOA: 02/02/2020  Requesting physician: Dr.Hijazi  Reason for consultation: Antibiotic recommendations   History of Present Illness: Jason Knox is an 69 y.o. male with history of coronary disease, hypertension, hyperlipidemia, congestive heart failure, hypothyroidism, COPD, diabetes mellitus type 2 who apparently was found unresponsive at home.  EMS found that his blood glucose was in the 40s and his heart rate was also in the 40s.  Patient had to be intubated on the scene for airway protection.  He was admitted to [facility] on 01/12/2020 for unresponsiveness and shortness of breath.  Apparently he had been diagnosed diagnosed with Covid 1 day prior to this event.  He was found to have volume overload and was given Lasix.  He was started on Decadron 10-day course for the Covid and was started on empiric antibiotics for possible pneumonia.  Neurology was consulted and EEG showed diffuse background slowing without evidence of seizure activity.  Patient also had several rhythm abnormalities throughout the hospitalization including A. fib, sinus bradycardia that required transcutaneous pacing, sinus rhythm with first-degree AV block and frequent PACs, junctional rhythm.  He has history of heart failure.  He was noted to have right-sided gaze, code stroke was called.  CT head was negative for acute changes.  CTA showed progression of high-grade stenosis with proximal right M2 branch vessel.  He had repeat EEG and MRI of the brain and was started on Keppra based on intermittent periodic discharges over the left parietal occipital region.  MRI showed small subacute to chronic right occipital lobe infarct, chronic cerebellar and right thalamic infarcts, mild chronic small vessel ischemic disease in the cerebral white matter.  He was also found to have elevated ammonia for which she was given lactulose.   Throughout the hospital course he continued to have waxing and waning mental status.  On 01/21/2020 he had trach/PEG placed.  He continued to have secretions with intermittent mucous plugging.  Hospital course complicated by persistent ventilator dependence, persistent encephalopathy, gout flare and possible epileptiform activity on his EEG.  Palliative care was consulted.  However family wanted to be aggressive therefore [facility]. [facility]  [facility].  He had blood cultures drawn on 02/09/2020 which showed Streptococcus viridans, coagulase-negative staph.  He is on treatment with IV vancomycin, Zosyn.  Patient at this time has a trach in place.  On ventilator.  He is opening eyes but not following any commands at this time.   Review of Systems:  Unable to obtain review of systems at this time.   Past Medical History: Past Medical History:  Diagnosis Date  . Carpal tunnel syndrome   . Cataract cortical, senile, bilateral   . Depression   . Diabetes mellitus (HCC)   . Diabetic peripheral neuropathy (HCC)   . Dyslipidemia   . Gout   . Hypothyroid   . MI (myocardial infarction) (HCC)   . Obesity   . Osteoarthritis   . Plantar fasciitis     Past Surgical History: Past Surgical History:  Procedure Laterality Date  . COLON SURGERY    . CORONARY ARTERY BYPASS GRAFT       Allergies: Doxycycline, lisinopril  Social History: Per the wife at the bedside no history of alcohol, tobacco or recreational drug abuse  Family History: History of coronary artery disease  in father and brother  Physical Exam: Temperature 98.4, pulse 67, respiratory rate 26, blood pressure 137/94, pulse oximetry 94%  Constitutional: Ill-appearing male, has trach in place, opening eyes but not following commands Eyes: PERLA ENMT: external ears and nose appear normal, Lips appears normal, unable to visualize oral mucosa or  oropharynx Neck: Has trach in place CVS: S1, S2, no murmurs Respiratory: Rhonchi, no wheezing Abdomen: Soft, positive bowel sounds, PEG tube in place Musculoskeletal: No edema Neuro: He is not following commands at this time.  Unable to do a neurologic exam Psych: Unable to assess at this time Skin: no rashes   Data reviewed:  I have personally reviewed following labs and imaging studies Labs:  CBC: Recent Labs  Lab 02/09/20 0452 02/12/20 0525  WBC 11.1* 18.5*  NEUTROABS 8.7*  --   HGB 10.7* 10.8*  HCT 34.4* 35.3*  MCV 97.7 98.6  PLT 440* 532*    Basic Metabolic Panel: Recent Labs  Lab 02/09/20 0452 02/10/20 0914 02/11/20 0441 02/12/20 0525  NA 143  --  140 145  K 5.3*   < > 4.5 4.5  CL 96*  --  102 104  CO2 34*  --  30 29  GLUCOSE 143*  --  321* 391*  BUN 21  --  49* 55*  CREATININE 0.80  --  1.00 0.93  CALCIUM 9.4  --  8.4* 8.6*   < > = values in this interval not displayed.   GFR CrCl cannot be calculated (Unknown ideal weight.). Liver Function Tests: Recent Labs  Lab 02/09/20 0452 02/11/20 0441  AST 51* 46*  ALT 35 37  ALKPHOS 150* 136*  BILITOT 1.3* 1.5*  PROT 7.1 6.9  ALBUMIN 2.0* 1.9*   No results for input(s): LIPASE, AMYLASE in the last 168 hours. No results for input(s): AMMONIA in the last 168 hours. Coagulation profile No results for input(s): INR, PROTIME in the last 168 hours.  Cardiac Enzymes: No results for input(s): CKTOTAL, CKMB, CKMBINDEX, TROPONINI in the last 168 hours. BNP: Invalid input(s): POCBNP CBG: No results for input(s): GLUCAP in the last 168 hours. D-Dimer No results for input(s): DDIMER in the last 72 hours. Hgb A1c No results for input(s): HGBA1C in the last 72 hours. Lipid Profile No results for input(s): CHOL, HDL, LDLCALC, TRIG, CHOLHDL, LDLDIRECT in the last 72 hours. Thyroid function studies No results for input(s): TSH, T4TOTAL, T3FREE, THYROIDAB in the last 72 hours.  Invalid input(s): FREET3 Anemia  work up No results for input(s): VITAMINB12, FOLATE, FERRITIN, TIBC, IRON, RETICCTPCT in the last 72 hours. Urinalysis    Component Value Date/Time   COLORURINE YELLOW 09/04/2017 1700   APPEARANCEUR CLOUDY (A) 09/04/2017 1700   LABSPEC 1.017 09/04/2017 1700   PHURINE 5.0 09/04/2017 1700   GLUCOSEU NEGATIVE 09/04/2017 1700   HGBUR NEGATIVE 09/04/2017 1700   BILIRUBINUR NEGATIVE 09/04/2017 1700   KETONESUR NEGATIVE 09/04/2017 1700   PROTEINUR NEGATIVE 09/04/2017 1700   NITRITE NEGATIVE 09/04/2017 1700   LEUKOCYTESUR NEGATIVE 09/04/2017 1700     Microbiology Recent Results (from the past 240 hour(s))  Culture, blood (routine x 2)     Status: Abnormal   Collection Time: 02/09/20  3:54 PM   Specimen: BLOOD  Result Value Ref Range Status   Specimen Description BLOOD LEFT ANTECUBITAL  Final   Special Requests   Final    BOTTLES DRAWN AEROBIC AND ANAEROBIC Blood Culture adequate volume   Culture  Setup Time   Final    IN BOTH  AEROBIC AND ANAEROBIC BOTTLES GRAM POSITIVE COCCI IN CLUSTERS CRITICAL RESULT CALLED TO, READ BACK BY AND VERIFIED WITH: L MCRAY RN 02/10/20 1718 JDW Performed at [facility], [address], [place], [place] [address]    Culture (A)  Final    STAPHYLOCOCCUS SPECIES (COAGULASE NEGATIVE) THE SIGNIFICANCE OF ISOLATING THIS ORGANISM FROM A SINGLE SET OF BLOOD CULTURES WHEN MULTIPLE SETS ARE DRAWN IS UNCERTAIN. PLEASE NOTIFY THE MICROBIOLOGY DEPARTMENT WITHIN ONE WEEK IF SPECIATION AND SENSITIVITIES ARE REQUIRED.    Report Status 02/12/2020 FINAL  Final  Culture, blood (routine x 2)     Status: Abnormal   Collection Time: 02/09/20  4:04 PM   Specimen: BLOOD LEFT HAND  Result Value Ref Range Status   Specimen Description BLOOD LEFT HAND  Final   Special Requests   Final    BOTTLES DRAWN AEROBIC AND ANAEROBIC Blood Culture adequate volume   Culture  Setup Time   Final    ANAEROBIC BOTTLE ONLY GRAM POSITIVE COCCI IN CHAINS CRITICAL RESULT CALLED TO, READ  BACK BY AND VERIFIED WITH: H TATA RN 02/11/20 0151 JDW    Culture (A)  Final    VIRIDANS STREPTOCOCCUS THE SIGNIFICANCE OF ISOLATING THIS ORGANISM FROM A SINGLE SET OF BLOOD CULTURES WHEN MULTIPLE SETS ARE DRAWN IS UNCERTAIN. PLEASE NOTIFY THE MICROBIOLOGY DEPARTMENT WITHIN ONE WEEK IF SPECIATION AND SENSITIVITIES ARE REQUIRED. Performed at [facility], [address] [place], [place] [address]    Report Status 02/13/2020 FINAL  Final       Inpatient Medications:   Please see MAR   Radiological Exams on Admission: DG Chest Port 1 View  Result Date: 02/13/2020 CLINICAL DATA:  PICC line placement EXAM: PORTABLE CHEST 1 VIEW COMPARISON:  02/09/2020 FINDINGS: 1136 hours. The cardio pericardial silhouette is enlarged. Tracheostomy tube noted. Right PICC line tip overlies the distal SVC level. There is pulmonary vascular congestion without overt pulmonary edema. Mild bibasilar collapse/consolidation with small bilateral pleural effusions evident. Telemetry leads overlie the chest. IMPRESSION: Right PICC line tip overlies the distal SVC level. Electronically Signed   By: Kennith Center M.D.   On: 02/13/2020 11:52    Impression/Recommendations Gram-positive bacteremia Sepsis Leukocytosis Acute respiratory failure, ventilator dependent COVID-19 infection Diabetes mellitus, uncontrolled Dysphagia Protein calorie malnutrition Encephalopathy Seizures Sacral pressure ulcer unspecified stage  Sepsis: Likely secondary to the gram-positive bacteremia.  Blood cultures showing Streptococcus viridans, coagulase-negative Staphylococcus.  Currently on treatment with IV vancomycin and Zosyn in addition to Levophed.  Source for the bacteremia is unclear at this time.  Recommend CT of the abdomen and pelvis to evaluate.  We will continue with IV vancomycin, not sure if he needs the Zosyn.  Will change to ceftriaxone because ceftriaxone should cover for the strep viridans.  Will order repeat blood  cultures.  Leukocytosis: Likely secondary to sepsis, gram-positive bacteremia.  Antibiotics as mentioned above.  Continue to monitor counts.  Acute respiratory failure, ventilator dependent: Patient continues to be on the vent.  He recently was diagnosed with COVID-19 infection.  He is status post treatment at the outside facility.  He is on hydroxyurea and folic acid here.  He was also treated with antibiotics for suspected secondary bacterial pneumonia at the outside facility.  Now restarted on antibiotics for the bacteremia.  Diabetes mellitus, uncontrolled: His blood glucose continues to be high.  Continue to monitor Accu-Cheks, management of diabetes per the primary team.  Encephalopathy: Patient had waxing and waning mental status even at the outside  facility per records from [facility].  Here he is opening eyes somewhat but not following any commands at this time.  On brain stimulating agents.  Continue current management per the primary team.  Seizures: Continue Keppra, also Neurontin.  Sacral pressure ulcer unspecified stage: Continue local wound care.  Dysphagia/protein calorie malnutrition: Management per the primary team.  He is on tube feeds.  Unfortunately due to his dysphagia is also high risk for aspiration and worsening respiratory failure secondary to aspiration pneumonia.  Due to his complex medical problems he is high risk for worsening and decompensation.  Thank you for involving [place] in the care of this patient.  Plan of care discussed with the wife at the bedside.  Plan of care also discussed with the primary team.   Thank you for this consultation.    Yaakov Guthrie M.D. 02/13/2020, 2:50 PM

## 2020-02-14 ENCOUNTER — Other Ambulatory Visit (HOSPITAL_COMMUNITY): Payer: Medicare HMO

## 2020-02-14 MED ORDER — IOHEXOL 300 MG/ML  SOLN
100.0000 mL | Freq: Once | INTRAMUSCULAR | Status: AC | PRN
  Administered 2020-02-14: 100 mL via INTRAVENOUS

## 2020-02-15 LAB — CBC
HCT: 34.1 % — ABNORMAL LOW (ref 39.0–52.0)
Hemoglobin: 10.5 g/dL — ABNORMAL LOW (ref 13.0–17.0)
MCH: 30.5 pg (ref 26.0–34.0)
MCHC: 30.8 g/dL (ref 30.0–36.0)
MCV: 99.1 fL (ref 80.0–100.0)
Platelets: 323 10*3/uL (ref 150–400)
RBC: 3.44 MIL/uL — ABNORMAL LOW (ref 4.22–5.81)
RDW: 17.7 % — ABNORMAL HIGH (ref 11.5–15.5)
WBC: 12.3 10*3/uL — ABNORMAL HIGH (ref 4.0–10.5)
nRBC: 0.3 % — ABNORMAL HIGH (ref 0.0–0.2)

## 2020-02-15 LAB — BASIC METABOLIC PANEL
Anion gap: 9 (ref 5–15)
BUN: 55 mg/dL — ABNORMAL HIGH (ref 8–23)
CO2: 29 mmol/L (ref 22–32)
Calcium: 8.7 mg/dL — ABNORMAL LOW (ref 8.9–10.3)
Chloride: 109 mmol/L (ref 98–111)
Creatinine, Ser: 0.7 mg/dL (ref 0.61–1.24)
GFR calc Af Amer: >60 mL/min (ref >60)
GFR calc non Af Amer: >60 mL/min (ref >60)
Glucose, Bld: 152 mg/dL — ABNORMAL HIGH (ref 70–99)
Potassium: 4 mmol/L (ref 3.5–5.1)
Sodium: 147 mmol/L — ABNORMAL HIGH (ref 135–145)

## 2020-02-15 LAB — VANCOMYCIN, TROUGH: Vancomycin Tr: 21 ug/mL (ref 15–20)

## 2020-02-15 LAB — MAGNESIUM: Magnesium: 2.4 mg/dL (ref 1.7–2.4)

## 2020-02-17 ENCOUNTER — Encounter: Payer: Self-pay | Admitting: Internal Medicine

## 2020-02-17 DIAGNOSIS — J9621 Acute and chronic respiratory failure with hypoxia: Secondary | ICD-10-CM | POA: Diagnosis not present

## 2020-02-17 DIAGNOSIS — U071 COVID-19: Secondary | ICD-10-CM

## 2020-02-17 DIAGNOSIS — I6389 Other cerebral infarction: Secondary | ICD-10-CM | POA: Diagnosis present

## 2020-02-17 DIAGNOSIS — I5022 Chronic systolic (congestive) heart failure: Secondary | ICD-10-CM | POA: Diagnosis not present

## 2020-02-17 DIAGNOSIS — G40909 Epilepsy, unspecified, not intractable, without status epilepticus: Secondary | ICD-10-CM

## 2020-02-17 LAB — BASIC METABOLIC PANEL
Anion gap: 9 (ref 5–15)
BUN: 62 mg/dL — ABNORMAL HIGH (ref 8–23)
CO2: 28 mmol/L (ref 22–32)
Calcium: 8.8 mg/dL — ABNORMAL LOW (ref 8.9–10.3)
Chloride: 110 mmol/L (ref 98–111)
Creatinine, Ser: 0.81 mg/dL (ref 0.61–1.24)
GFR calc Af Amer: >60 mL/min (ref >60)
GFR calc non Af Amer: >60 mL/min (ref >60)
Glucose, Bld: 241 mg/dL — ABNORMAL HIGH (ref 70–99)
Potassium: 4.9 mmol/L (ref 3.5–5.1)
Sodium: 147 mmol/L — ABNORMAL HIGH (ref 135–145)

## 2020-02-17 LAB — CBC
HCT: 36.8 % — ABNORMAL LOW (ref 39.0–52.0)
Hemoglobin: 11.3 g/dL — ABNORMAL LOW (ref 13.0–17.0)
MCH: 30.7 pg (ref 26.0–34.0)
MCHC: 30.7 g/dL (ref 30.0–36.0)
MCV: 100 fL (ref 80.0–100.0)
Platelets: 275 10*3/uL (ref 150–400)
RBC: 3.68 MIL/uL — ABNORMAL LOW (ref 4.22–5.81)
RDW: 18.6 % — ABNORMAL HIGH (ref 11.5–15.5)
WBC: 7.7 10*3/uL (ref 4.0–10.5)
nRBC: 0.9 % — ABNORMAL HIGH (ref 0.0–0.2)

## 2020-02-17 NOTE — Consult Note (Signed)
Pulmonary [place]  Date of Service: 02/17/2020  PULMONARY CRITICAL CARE Jason Knox  PJA:[id number]  DOB: July 30, 1951   DOA: 02/17/2020  Referring Physician: Merton Border, MD  HPI: Jason Knox is a 69 y.o. male seen for follow up of Acute on Chronic Respiratory Failure.  Patient has multiple medical problems including stroke dysphagia coronary artery disease CKD stage II pressure ulcer systolic heart failure gout presents to the hospital with unresponsiveness.  Patient had been previously diagnosed with COVID-19 at an urgent care and had been seen after a fall.  CT of the head and neck was done which was negative on admission.  Patient was noted to be significantly bradycardic as well as hypoglycemic so was intubated and neurology saw the patient with an EEG that having been done.  Hospital course EEG was done showing diffuse background slowing without any active seizures.  MRI was also ordered.  Cardiology saw the patient for the bradycardia and patient had sinus bradycardia on admission requiring pacemaking.  Patient had repeat EEG done and was started on Keppra for intermittent discharges noted.  Patient also had complications with development of healthcare associated pneumonia so was treated with antibiotics.  Because patient did not come off of the ventilator patient underwent a tracheostomy.  Secretions have been somewhat of an issue.  Patient was transferred to our facility for further management and weaning.  Review of Systems:  ROS performed and is unremarkable other than noted above.  Past Medical History:  Diagnosis Date  . Carpal tunnel syndrome   . Cataract cortical, senile, bilateral   . Depression   . Diabetes mellitus ([place])   . Diabetic peripheral neuropathy ([place])   . Dyslipidemia   . Gout   . Hypothyroid   . MI (myocardial infarction) ([place])   . Obesity   . Osteoarthritis   . Plantar fasciitis      Past Surgical History:  Procedure Laterality Date  . COLON SURGERY    . CORONARY ARTERY BYPASS GRAFT      Social History:   Unable to obtain  Family History: Non-Contributory to the present illness  No Known Allergies  Medications: Reviewed on Rounds  Physical Exam:  Vitals: Temperature 96.9 pulse 54 respiratory 18 blood pressure is 146/70 saturations 97%  Ventilator Settings mode of ventilation pressure support FiO2 28% pressure poor 12 PEEP 5  . General: Comfortable at this time . Eyes: Grossly normal lids, irises & conjunctiva . ENT: grossly tongue is normal . Neck: no obvious mass . Cardiovascular: S1-S2 normal no gallop or rub . Respiratory: No rhonchi no rales are noted at this time . Abdomen: Soft and nontender . Skin: no rash seen on limited exam . Musculoskeletal: not rigid . Psychiatric:unable to assess . Neurologic: no seizure no involuntary movements         Labs on Admission:  Basic Metabolic Panel: Recent Labs  Lab 02/11/20 0441 02/12/20 0525 02/15/20 0711 02/17/20 0700  NA 140 145 147* 147*  K 4.5 4.5 4.0 4.9  CL 102 104 109 110  CO2 30 29 29 28   GLUCOSE 321* 391* 152* 241*  BUN 49* 55* 55* 62*  CREATININE 1.00 0.93 0.70 0.81  CALCIUM 8.4* 8.6* 8.7* 8.8*  MG  --   --  2.4  --     No results for input(s): PHART, PCO2ART, PO2ART, HCO3, O2SAT in the last 168 hours.  Liver Function Tests: Recent Labs  Lab 02/11/20  0441  AST 46*  ALT 37  ALKPHOS 136*  BILITOT 1.5*  PROT 6.9  ALBUMIN 1.9*   No results for input(s): LIPASE, AMYLASE in the last 168 hours. No results for input(s): AMMONIA in the last 168 hours.  CBC: Recent Labs  Lab 02/12/20 0525 02/15/20 0711 02/17/20 0700  WBC 18.5* 12.3* 7.7  HGB 10.8* 10.5* 11.3*  HCT 35.3* 34.1* 36.8*  MCV 98.6 99.1 100.0  PLT 532* 323 275    Cardiac Enzymes: No results for input(s): CKTOTAL, CKMB, CKMBINDEX, TROPONINI in the last 168 hours.  BNP (last 3 results) No results for  input(s): BNP in the last 8760 hours.  ProBNP (last 3 results) No results for input(s): PROBNP in the last 8760 hours.   Radiological Exams on Admission: CT ABDOMEN PELVIS W CONTRAST  Result Date: 02/14/2020 CLINICAL DATA:  Abdominal abscess or infection. EXAM: CT ABDOMEN AND PELVIS WITH CONTRAST TECHNIQUE: Multidetector CT imaging of the abdomen and pelvis was performed using the standard protocol following bolus administration of intravenous contrast. CONTRAST:  OMNIPAQUE IOHEXOL 300 MG/ML  SOLN COMPARISON:  June 03, 2018. FINDINGS: Lower chest: Small bilateral pleural effusions are noted with adjacent atelectasis or infiltrates involving both lower lobes. Hepatobiliary: No focal liver abnormality is seen. No gallstones, gallbladder wall thickening, or biliary dilatation. Pancreas: Unremarkable. No pancreatic ductal dilatation or surrounding inflammatory changes. Spleen: Normal in size without focal abnormality. Adrenals/Urinary Tract: Adrenal glands appear normal. Duplicated left ureter is noted. Simple cyst is seen in left lower pole. No hydronephrosis or renal obstruction is noted. Urinary bladder is decompressed secondary to Foley catheter. Stomach/Bowel: Gastrostomy tube appears to be well position within distal stomach. There is no evidence of bowel obstruction or inflammation. The appendix is not clearly visualized, but no inflammation is noted in the right lower quadrant. Vascular/Lymphatic: Aortic atherosclerosis. No enlarged abdominal or pelvic lymph nodes. Reproductive: Prostate is unremarkable. Other: Small amount of free fluid is noted in the pelvis. Musculoskeletal: No acute or significant osseous findings. IMPRESSION: 1. Small bilateral pleural effusions are noted with adjacent atelectasis or infiltrates involving both lower lobes. 2. Gastrostomy tube appears to be well position within distal stomach. 3. Small amount of free fluid is noted in the pelvis. 4. No other significant  abnormality seen in the abdomen or pelvis. Aortic Atherosclerosis (ICD10-I70.0). Electronically Signed   By: Lupita Raider M.D.   On: 02/14/2020 12:28    Assessment/Plan Active Problems:   Acute on chronic respiratory failure with hypoxia (HCC)   COVID-19 virus infection   Acute arterial ischemic stroke, multifocal, multiple vascular territories [facility])   Seizure disorder (HCC)   Chronic systolic heart failure (HCC)   1. Acute on chronic respiratory failure hypoxia patient is on the weaning protocol today will be doing pressure support 12/5 and the goal is to go for 8 hours maximum.  Alert continue to follow and advance per protocol. 2. COVID-19 virus infection in resolution phase we will continue to monitor radiologically. 3. Acute stroke patient has residual deficits continue with therapy as tolerated. 4. Seizure disorder patient had been on Keppra at the other facility the plan is going to be to continue to monitor for any active seizures at this point. 5. Chronic systolic heart failure appears to be compensated we will continue to monitor the fluid status closely.  I have personally seen and evaluated the patient, evaluated laboratory and imaging results, formulated the assessment and plan and placed orders. The Patient requires high complexity decision making with  multiple systems involvement.  Case was discussed on Rounds with the Respiratory Therapy Director and the Respiratory staff Time Spent  Yevonne Pax, MD [facility] Pulmonary Critical Care Medicine Sleep Medicine

## 2020-02-18 DIAGNOSIS — I6389 Other cerebral infarction: Secondary | ICD-10-CM | POA: Diagnosis not present

## 2020-02-18 DIAGNOSIS — I5022 Chronic systolic (congestive) heart failure: Secondary | ICD-10-CM | POA: Diagnosis not present

## 2020-02-18 DIAGNOSIS — U071 COVID-19: Secondary | ICD-10-CM | POA: Diagnosis not present

## 2020-02-18 DIAGNOSIS — J9621 Acute and chronic respiratory failure with hypoxia: Secondary | ICD-10-CM | POA: Diagnosis not present

## 2020-02-18 LAB — CBC
HCT: 35 % — ABNORMAL LOW (ref 39.0–52.0)
HCT: 36.2 % — ABNORMAL LOW (ref 39.0–52.0)
HCT: 34.5 % — ABNORMAL LOW (ref 39.0–52.0)
Hemoglobin: 10.7 g/dL — ABNORMAL LOW (ref 13.0–17.0)
Hemoglobin: 11.2 g/dL — ABNORMAL LOW (ref 13.0–17.0)
Hemoglobin: 10.7 g/dL — ABNORMAL LOW (ref 13.0–17.0)
MCH: 30.8 pg (ref 26.0–34.0)
MCH: 31 pg (ref 26.0–34.0)
MCH: 30.6 pg (ref 26.0–34.0)
MCHC: 30.6 g/dL (ref 30.0–36.0)
MCHC: 30.9 g/dL (ref 30.0–36.0)
MCHC: 31 g/dL (ref 30.0–36.0)
MCV: 100.9 fL — ABNORMAL HIGH (ref 80.0–100.0)
MCV: 100.3 fL — ABNORMAL HIGH (ref 80.0–100.0)
MCV: 98.6 fL (ref 80.0–100.0)
Platelets: 242 10*3/uL (ref 150–400)
Platelets: 268 10*3/uL (ref 150–400)
Platelets: 250 10*3/uL (ref 150–400)
RBC: 3.47 MIL/uL — ABNORMAL LOW (ref 4.22–5.81)
RBC: 3.61 MIL/uL — ABNORMAL LOW (ref 4.22–5.81)
RBC: 3.5 MIL/uL — ABNORMAL LOW (ref 4.22–5.81)
RDW: 19.5 % — ABNORMAL HIGH (ref 11.5–15.5)
RDW: 19.4 % — ABNORMAL HIGH (ref 11.5–15.5)
RDW: 19.4 % — ABNORMAL HIGH (ref 11.5–15.5)
WBC: 7.8 10*3/uL (ref 4.0–10.5)
WBC: 10.5 10*3/uL (ref 4.0–10.5)
WBC: 9.6 10*3/uL (ref 4.0–10.5)
nRBC: 0.4 % — ABNORMAL HIGH (ref 0.0–0.2)
nRBC: 0.2 % (ref 0.0–0.2)
nRBC: 0.2 % (ref 0.0–0.2)

## 2020-02-18 LAB — CULTURE, BLOOD (ROUTINE X 2)
Culture: NO GROWTH
Culture: NO GROWTH
Special Requests: ADEQUATE
Special Requests: ADEQUATE

## 2020-02-18 LAB — OCCULT BLOOD X 1 CARD TO LAB, STOOL: Fecal Occult Bld: POSITIVE — AB

## 2020-02-18 NOTE — Progress Notes (Signed)
Pulmonary Critical Care Medicine [facility] [place]   PULMONARY CRITICAL CARE SERVICE  PROGRESS NOTE  Date of Service: 02/18/2020  Jason Knox  OYD:[id number]  DOB: 1951/09/02   DOA: 01/27/2020  Referring Physician: Carron Curie, MD  HPI: Jason Knox is a 69 y.o. male seen for follow up of Acute on Chronic Respiratory Failure.  Patient is on pressure support mode currently on 28% FiO2 12/5 has been on a goal of 16 hours  Medications: Reviewed on Rounds  Physical Exam:  Vitals: Temperature is 96.0 pulse 52 respiratory rate 16 blood pressure is 120/66 saturations 99%  Ventilator Settings on pressure support FiO2 28% pressure support 12 PEEP 5  . General: Comfortable at this time . Eyes: Grossly normal lids, irises & conjunctiva . ENT: grossly tongue is normal . Neck: no obvious mass . Cardiovascular: S1 S2 normal no gallop . Respiratory: No rhonchi no rales are noted at this time . Abdomen: soft . Skin: no rash seen on limited exam . Musculoskeletal: not rigid . Psychiatric:unable to assess . Neurologic: no seizure no involuntary movements         Lab Data:   Basic Metabolic Panel: Recent Labs  Lab 02/12/20 0525 02/15/20 0711 02/17/20 0700  NA 145 147* 147*  K 4.5 4.0 4.9  CL 104 109 110  CO2 29 29 28   GLUCOSE 391* 152* 241*  BUN 55* 55* 62*  CREATININE 0.93 0.70 0.81  CALCIUM 8.6* 8.7* 8.8*  MG  --  2.4  --     ABG: No results for input(s): PHART, PCO2ART, PO2ART, HCO3, O2SAT in the last 168 hours.  Liver Function Tests: No results for input(s): AST, ALT, ALKPHOS, BILITOT, PROT, ALBUMIN in the last 168 hours. No results for input(s): LIPASE, AMYLASE in the last 168 hours. No results for input(s): AMMONIA in the last 168 hours.  CBC: Recent Labs  Lab 02/12/20 0525 02/15/20 0711 02/17/20 0700 02/18/20 0848  WBC 18.5* 12.3* 7.7 7.8  HGB 10.8* 10.5* 11.3* 10.7*  HCT 35.3* 34.1* 36.8* 35.0*  MCV 98.6 99.1 100.0 100.9*  PLT 532* 323 275  242    Cardiac Enzymes: No results for input(s): CKTOTAL, CKMB, CKMBINDEX, TROPONINI in the last 168 hours.  BNP (last 3 results) No results for input(s): BNP in the last 8760 hours.  ProBNP (last 3 results) No results for input(s): PROBNP in the last 8760 hours.  Radiological Exams: No results found.  Assessment/Plan Active Problems:   Acute on chronic respiratory failure with hypoxia (HCC)   COVID-19 virus infection   Acute arterial ischemic stroke, multifocal, multiple vascular territories [facility])   Seizure disorder (HCC)   Chronic systolic heart failure (HCC)   1. Acute on chronic respiratory failure hypoxia patient is going to continue with pressure support mode titrate oxygen continue pulmonary toilet. 2. COVID-19 virus infection treated we will continue with present therapy 3. Acute stroke no change continue present therapy 4. Seizure disorder no active seizure noted 5. Chronic systolic heart failure baseline we will continue to follow   I have personally seen and evaluated the patient, evaluated laboratory and imaging results, formulated the assessment and plan and placed orders. The Patient requires high complexity decision making with multiple systems involvement.  Rounds were done with the Respiratory Therapy Director and Staff therapists and discussed with nursing staff also.  [facility], MD [facility] Pulmonary Critical Care Medicine Sleep Medicine

## 2020-02-19 DIAGNOSIS — U071 COVID-19: Secondary | ICD-10-CM | POA: Diagnosis not present

## 2020-02-19 DIAGNOSIS — I6389 Other cerebral infarction: Secondary | ICD-10-CM | POA: Diagnosis not present

## 2020-02-19 DIAGNOSIS — J9621 Acute and chronic respiratory failure with hypoxia: Secondary | ICD-10-CM | POA: Diagnosis not present

## 2020-02-19 DIAGNOSIS — I5022 Chronic systolic (congestive) heart failure: Secondary | ICD-10-CM | POA: Diagnosis not present

## 2020-02-19 LAB — CBC
HCT: 38.4 % — ABNORMAL LOW (ref 39.0–52.0)
HCT: 37.1 % — ABNORMAL LOW (ref 39.0–52.0)
Hemoglobin: 11.7 g/dL — ABNORMAL LOW (ref 13.0–17.0)
Hemoglobin: 11.4 g/dL — ABNORMAL LOW (ref 13.0–17.0)
MCH: 31 pg (ref 26.0–34.0)
MCH: 31.3 pg (ref 26.0–34.0)
MCHC: 30.5 g/dL (ref 30.0–36.0)
MCHC: 30.7 g/dL (ref 30.0–36.0)
MCV: 101.6 fL — ABNORMAL HIGH (ref 80.0–100.0)
MCV: 101.9 fL — ABNORMAL HIGH (ref 80.0–100.0)
Platelets: 238 10*3/uL (ref 150–400)
Platelets: 249 10*3/uL (ref 150–400)
RBC: 3.78 MIL/uL — ABNORMAL LOW (ref 4.22–5.81)
RBC: 3.64 MIL/uL — ABNORMAL LOW (ref 4.22–5.81)
RDW: 19.8 % — ABNORMAL HIGH (ref 11.5–15.5)
RDW: 19.9 % — ABNORMAL HIGH (ref 11.5–15.5)
WBC: 10.7 10*3/uL — ABNORMAL HIGH (ref 4.0–10.5)
WBC: 10.2 10*3/uL (ref 4.0–10.5)
nRBC: 0.2 % (ref 0.0–0.2)
nRBC: 0.2 % (ref 0.0–0.2)

## 2020-02-19 LAB — BASIC METABOLIC PANEL
Anion gap: 10 (ref 5–15)
BUN: 69 mg/dL — ABNORMAL HIGH (ref 8–23)
CO2: 27 mmol/L (ref 22–32)
Calcium: 8.8 mg/dL — ABNORMAL LOW (ref 8.9–10.3)
Chloride: 111 mmol/L (ref 98–111)
Creatinine, Ser: 0.74 mg/dL (ref 0.61–1.24)
GFR calc Af Amer: >60 mL/min (ref >60)
GFR calc non Af Amer: >60 mL/min (ref >60)
Glucose, Bld: 166 mg/dL — ABNORMAL HIGH (ref 70–99)
Potassium: 5 mmol/L (ref 3.5–5.1)
Sodium: 148 mmol/L — ABNORMAL HIGH (ref 135–145)

## 2020-02-19 LAB — OCCULT BLOOD X 1 CARD TO LAB, STOOL: Fecal Occult Bld: POSITIVE — AB

## 2020-02-19 NOTE — Progress Notes (Signed)
Pulmonary Critical Care Medicine [facility] [place]   PULMONARY CRITICAL CARE SERVICE  PROGRESS NOTE  Date of Service: 02/19/2020  Jason Knox  KWI:[id number]  DOB: July 03, 1951   DOA: 01/29/2020  Referring Physician: Carron Curie, MD  HPI: Jason Knox is a 69 y.o. male seen for follow up of Acute on Chronic Respiratory Failure.  Patient at this time is on the NAG has been on 28% FiO2 on pressure support resting mode  Medications: Reviewed on Rounds  Physical Exam:  Vitals: Temperature 96.8 6 pulse 71 respiratory rate 20 blood pressure is 136/72 saturations 98%  Ventilator Settings off the ventilator on the NAG FiO2 is 28%  . General: Comfortable at this time . Eyes: Grossly normal lids, irises & conjunctiva . ENT: grossly tongue is normal . Neck: no obvious mass . Cardiovascular: S1 S2 normal no gallop . Respiratory: Coarse breath sounds with some scattered rhonchi . Abdomen: soft . Skin: no rash seen on limited exam . Musculoskeletal: not rigid . Psychiatric:unable to assess . Neurologic: no seizure no involuntary movements         Lab Data:   Basic Metabolic Panel: Recent Labs  Lab 02/15/20 0711 02/17/20 0700 02/19/20 0218  NA 147* 147* 148*  K 4.0 4.9 5.0  CL 109 110 111  CO2 29 28 27   GLUCOSE 152* 241* 166*  BUN 55* 62* 69*  CREATININE 0.70 0.81 0.74  CALCIUM 8.7* 8.8* 8.8*  MG 2.4  --   --     ABG: No results for input(s): PHART, PCO2ART, PO2ART, HCO3, O2SAT in the last 168 hours.  Liver Function Tests: No results for input(s): AST, ALT, ALKPHOS, BILITOT, PROT, ALBUMIN in the last 168 hours. No results for input(s): LIPASE, AMYLASE in the last 168 hours. No results for input(s): AMMONIA in the last 168 hours.  CBC: Recent Labs  Lab 02/18/20 0848 02/18/20 1509 02/18/20 2019 02/19/20 0218 02/19/20 0746  WBC 7.8 10.5 9.6 10.7* 10.2  HGB 10.7* 11.2* 10.7* 11.7* 11.4*  HCT 35.0* 36.2* 34.5* 38.4* 37.1*  MCV 100.9* 100.3* 98.6  101.6* 101.9*  PLT 242 268 250 238 249    Cardiac Enzymes: No results for input(s): CKTOTAL, CKMB, CKMBINDEX, TROPONINI in the last 168 hours.  BNP (last 3 results) No results for input(s): BNP in the last 8760 hours.  ProBNP (last 3 results) No results for input(s): PROBNP in the last 8760 hours.  Radiological Exams: No results found.  Assessment/Plan Active Problems:   Acute on chronic respiratory failure with hypoxia (HCC)   COVID-19 virus infection   Acute arterial ischemic stroke, multifocal, multiple vascular territories [facility])   Seizure disorder (HCC)   Chronic systolic heart failure (HCC)   1. Chronic respiratory failure with hypoxia plan is going to be to continue to wean off the ventilator as tolerated. 2. COVID-19 virus infection baseline now resolved we will continue to monitor 3. Acute stroke therapy as tolerated 4. Seizure disorder no active seizures 5. Chronic systolic heart failure patient is at baseline   I have personally seen and evaluated the patient, evaluated laboratory and imaging results, formulated the assessment and plan and placed orders. The Patient requires high complexity decision making with multiple systems involvement.  Rounds were done with the Respiratory Therapy Director and Staff therapists and discussed with nursing staff also.  [facility], MD [facility] Pulmonary Critical Care Medicine Sleep Medicine

## 2020-02-20 DIAGNOSIS — I5022 Chronic systolic (congestive) heart failure: Secondary | ICD-10-CM | POA: Diagnosis not present

## 2020-02-20 DIAGNOSIS — I6389 Other cerebral infarction: Secondary | ICD-10-CM | POA: Diagnosis not present

## 2020-02-20 DIAGNOSIS — U071 COVID-19: Secondary | ICD-10-CM | POA: Diagnosis not present

## 2020-02-20 DIAGNOSIS — J9621 Acute and chronic respiratory failure with hypoxia: Secondary | ICD-10-CM | POA: Diagnosis not present

## 2020-02-20 NOTE — Progress Notes (Signed)
Pulmonary Critical Care Medicine [facility] [place]   PULMONARY CRITICAL CARE SERVICE  PROGRESS NOTE  Date of Service: 02/20/2020  Jason Knox  GHW:[id number]  DOB: January 06, 1951   DOA: 01/29/2020  Referring Physician: Carron Curie, MD  HPI: Jason Knox is a 69 y.o. male seen for follow up of Acute on Chronic Respiratory Failure.  Patient currently is on the NAG on 2 L of oxygen good saturations are noted  Medications: Reviewed on Rounds  Physical Exam:  Vitals: Temperature is 96.5 pulse 60 respiratory 20 blood pressure is 121/72 saturations 97%  Ventilator Settings off the ventilator on the NAG on 2 L  . General: Comfortable at this time . Eyes: Grossly normal lids, irises & conjunctiva . ENT: grossly tongue is normal . Neck: no obvious mass . Cardiovascular: S1 S2 normal no gallop . Respiratory: No rhonchi no rales are noted at this time . Abdomen: soft . Skin: no rash seen on limited exam . Musculoskeletal: not rigid . Psychiatric:unable to assess . Neurologic: no seizure no involuntary movements         Lab Data:   Basic Metabolic Panel: Recent Labs  Lab 02/15/20 0711 02/17/20 0700 02/19/20 0218  NA 147* 147* 148*  K 4.0 4.9 5.0  CL 109 110 111  CO2 29 28 27   GLUCOSE 152* 241* 166*  BUN 55* 62* 69*  CREATININE 0.70 0.81 0.74  CALCIUM 8.7* 8.8* 8.8*  MG 2.4  --   --     ABG: No results for input(s): PHART, PCO2ART, PO2ART, HCO3, O2SAT in the last 168 hours.  Liver Function Tests: No results for input(s): AST, ALT, ALKPHOS, BILITOT, PROT, ALBUMIN in the last 168 hours. No results for input(s): LIPASE, AMYLASE in the last 168 hours. No results for input(s): AMMONIA in the last 168 hours.  CBC: Recent Labs  Lab 02/18/20 0848 02/18/20 1509 02/18/20 2019 02/19/20 0218 02/19/20 0746  WBC 7.8 10.5 9.6 10.7* 10.2  HGB 10.7* 11.2* 10.7* 11.7* 11.4*  HCT 35.0* 36.2* 34.5* 38.4* 37.1*  MCV 100.9* 100.3* 98.6 101.6* 101.9*  PLT 242 268 250  238 249    Cardiac Enzymes: No results for input(s): CKTOTAL, CKMB, CKMBINDEX, TROPONINI in the last 168 hours.  BNP (last 3 results) No results for input(s): BNP in the last 8760 hours.  ProBNP (last 3 results) No results for input(s): PROBNP in the last 8760 hours.  Radiological Exams: No results found.  Assessment/Plan Active Problems:   Acute on chronic respiratory failure with hypoxia (HCC)   COVID-19 virus infection   Acute arterial ischemic stroke, multifocal, multiple vascular territories [facility])   Seizure disorder (HCC)   Chronic systolic heart failure (HCC)   1. Acute on chronic respiratory failure with hypoxia continue with NAG titrate oxygen continue pulmonary toilet 2. COVID-19 virus infection right now in resolution phase 3. Acute stroke no change 4. Seizure disorder right now no active seizure noted 5. Chronic systolic heart failure compensated we will continue to monitor closely   I have personally seen and evaluated the patient, evaluated laboratory and imaging results, formulated the assessment and plan and placed orders. The Patient requires high complexity decision making with multiple systems involvement.  Rounds were done with the Respiratory Therapy Director and Staff therapists and discussed with nursing staff also.  [facility], MD [facility] Pulmonary Critical Care Medicine Sleep Medicine

## 2020-02-20 NOTE — Progress Notes (Signed)
PROGRESS NOTE    Jason Knox  XBJ:[id number] DOB: April 25, 1951 DOA: 02/17/2020  Brief Narrative:  Jason Knox is an 69 y.o. male with history of coronary disease, hypertension, hyperlipidemia, congestive heart failure, hypothyroidism, COPD, diabetes mellitus type 2 who apparently was found unresponsive at home.  EMS found that his blood glucose was in the 40s and his heart rate was also in the 40s.  Patient had to be intubated on the scene for airway protection.  He was admitted to [facility] on 01/12/2020 for unresponsiveness and shortness of breath.  Apparently he had been diagnosed diagnosed with Covid 1 day prior to this event.  He was found to have volume overload and was given Lasix.  He was started on Decadron 10-day course for the Covid and was started on empiric antibiotics for possible pneumonia.  Neurology was consulted and EEG showed diffuse background slowing without evidence of seizure activity.  Patient also had several rhythm abnormalities throughout the hospitalization including A. fib, sinus bradycardia that required transcutaneous pacing, sinus rhythm with first-degree AV block and frequent PACs, junctional rhythm.  He has history of heart failure.  He was noted to have right-sided gaze, code stroke was called.  CT head was negative for acute changes.  CTA showed progression of high-grade stenosis with proximal right M2 branch vessel.  He had repeat EEG and MRI of the brain and was started on Keppra based on intermittent periodic discharges over the left parietal occipital region.  MRI showed small subacute to chronic right occipital lobe infarct, chronic cerebellar and right thalamic infarcts, mild chronic small vessel ischemic disease in the cerebral white matter.  He was also found to have elevated ammonia for which he was given lactulose.  Throughout the hospital course he continued to have waxing and waning mental status.  On 01/21/2020 he had trach/PEG placed.  He  continued to have secretions with intermittent mucous plugging.  Hospital course complicated by persistent ventilator dependence, persistent encephalopathy, gout flare and possible epileptiform activity on his EEG.  Palliative care was consulted.  However family wanted to be aggressive therefore [facility]. [facility]  [facility].  He had blood cultures drawn on 02/09/2020 which showed Streptococcus viridans, coagulase-negative staph.  He is on treatment with IV vancomycin, Zosyn.  Patient at this time has a trach in place.  On ventilator.  He is opening eyes but not following any commands at this time.   Assessment & Plan:   Active Problems:   Acute on chronic respiratory failure with hypoxia (HCC)   COVID-19 virus infection   Acute arterial ischemic stroke, multifocal, multiple vascular territories ([place])   Seizure disorder (HCC)   Chronic systolic heart failure (HCC) Gram-positive bacteremia Sepsis Leukocytosis Diabetes mellitus, uncontrolled Dysphagia Protein calorie malnutrition Encephalopathy Sacral pressure ulcer unspecified stage  Sepsis: Likely secondary to the gram-positive bacteremia.  Blood cultures showing Streptococcus viridans, coagulase-negative Staphylococcus.  Was on treatment with IV vancomycin and Zosyn.  Source for the bacteremia is unclear at this time.    CT of the abdomen and pelvis done on 02/14/2020 per report small bilateral pleural effusions with adjacent atelectasis or infiltrates, no other acute abnormalities noted.   Repeat blood cultures from 02/13/2020 did not show any growth to date.  Discontinue antibiotics after tomorrow's dose.  Leukocytosis: Likely secondary to sepsis, gram-positive bacteremia.  Antibiotics as mentioned above.  WBC count appears to be stable.  Acute respiratory failure: He  recently was diagnosed with COVID-19 infection.  He is status post treatment at the outside  facility.  He was treated with hydroxyurea and folic acid here.  He was also treated with antibiotics for suspected secondary bacterial pneumonia at the outside facility.  Now on antibiotics for the bacteremia.  Respite status appears to be stable at this time.  However, he also has dysphagia therefore high risk for aspiration and worsening respiratory failure secondary to aspiration pneumonia.  If his respiratory is worsening, would recommend to send for repeat respiratory cultures and chest imaging preferably chest CT to better evaluate.  Diabetes mellitus, uncontrolled: Continue to monitor Accu-Cheks, management of diabetes per the primary team.  Encephalopathy: Patient had waxing and waning mental status even at the outside facility per records from [facility].  Here he is opening eyes somewhat but not following any commands at this time.  On brain stimulating agents.  Continue current management per the primary team.  Seizures: Continue Keppra, also Neurontin.  Sacral pressure ulcer unspecified stage: Continue local wound care.  Dysphagia/protein calorie malnutrition: Management per the primary team.  He is on tube feeds.  Unfortunately due to his dysphagia is also high risk for aspiration and worsening respiratory failure secondary to aspiration pneumonia.  Due to his complex medical problems he is high risk for worsening and decompensation. Plan of care discussed with the wife at the bedside.  Plan of care also discussed with the primary team.  Subjective: He is opening eyes but not following commands.  Objective: Vitals: Temperature 96.5, pulse 61, respiratory rate 20, blood pressure 139/78, pulse oximetry 100%  Examination: Constitutional: Ill-appearing male, has trach in place, opening eyes but not following commands Eyes: PERLA ENMT: external ears and nose appear normal, Lips appears normal, poor dentition Neck: Has trach in place CVS: S1, S2, no  murmurs Respiratory: Rhonchi, no wheezing Abdomen: Soft, positive bowel sounds, PEG tube in place Musculoskeletal: No edema Neuro: He is not following commands at this time.  Unable to do a neurologic exam Psych: Unable to assess at this time Skin: no rashes     Data Reviewed: I have personally reviewed following labs and imaging studies  CBC: Recent Labs  Lab 02/18/20 0848 02/18/20 1509 02/18/20 2019 02/19/20 0218 02/19/20 0746  WBC 7.8 10.5 9.6 10.7* 10.2  HGB 10.7* 11.2* 10.7* 11.7* 11.4*  HCT 35.0* 36.2* 34.5* 38.4* 37.1*  MCV 100.9* 100.3* 98.6 101.6* 101.9*  PLT 242 268 250 238 249   Basic Metabolic Panel: Recent Labs  Lab 02/15/20 0711 02/17/20 0700 02/19/20 0218  NA 147* 147* 148*  K 4.0 4.9 5.0  CL 109 110 111  CO2 29 28 27   GLUCOSE 152* 241* 166*  BUN 55* 62* 69*  CREATININE 0.70 0.81 0.74  CALCIUM 8.7* 8.8* 8.8*  MG 2.4  --   --    GFR: CrCl cannot be calculated (Unknown ideal weight.). Liver Function Tests: No results for input(s): AST, ALT, ALKPHOS, BILITOT, PROT, ALBUMIN in the last 168 hours. No results for input(s): LIPASE, AMYLASE in the last 168 hours. No results for input(s): AMMONIA in the last 168 hours. Coagulation Profile: No results for input(s): INR, PROTIME in the last 168 hours. Cardiac Enzymes: No results for input(s): CKTOTAL, CKMB, CKMBINDEX, TROPONINI in the last 168 hours. BNP (last 3 results) No results for input(s): PROBNP in the last 8760 hours. HbA1C: No results for input(s): HGBA1C in the last 72 hours. CBG: No results for input(s): GLUCAP in the  last 168 hours. Lipid Profile: No results for input(s): CHOL, HDL, LDLCALC, TRIG, CHOLHDL, LDLDIRECT in the last 72 hours. Thyroid Function Tests: No results for input(s): TSH, T4TOTAL, FREET4, T3FREE, THYROIDAB in the last 72 hours. Anemia Panel: No results for input(s): VITAMINB12, FOLATE, FERRITIN, TIBC, IRON, RETICCTPCT in the last 72 hours. Sepsis Labs: No results for  input(s): PROCALCITON, LATICACIDVEN in the last 168 hours.  Recent Results (from the past 240 hour(s))  Culture, blood (routine x 2)     Status: None   Collection Time: 02/13/20  3:38 PM   Specimen: BLOOD RIGHT HAND  Result Value Ref Range Status   Specimen Description BLOOD RIGHT HAND  Final   Special Requests   Final    BOTTLES DRAWN AEROBIC ONLY Blood Culture adequate volume   Culture   Final    NO GROWTH 5 DAYS Performed at [facility], [address] [place], [place] [address]    Report Status 02/18/2020 FINAL  Final  Culture, blood (routine x 2)     Status: None   Collection Time: 02/13/20  3:38 PM   Specimen: BLOOD RIGHT HAND  Result Value Ref Range Status   Specimen Description BLOOD RIGHT HAND  Final   Special Requests   Final    BOTTLES DRAWN AEROBIC ONLY Blood Culture adequate volume   Culture   Final    NO GROWTH 5 DAYS Performed at [facility], [address] [place], [place] [address]    Report Status 02/18/2020 FINAL  Final         Radiology Studies: No results found.  Scheduled Meds: Please see MAR  Vonzella Nipple, MD  02/20/2020, 4:13 PM

## 2020-02-20 DEATH — deceased

## 2020-02-21 DIAGNOSIS — I6389 Other cerebral infarction: Secondary | ICD-10-CM | POA: Diagnosis not present

## 2020-02-21 DIAGNOSIS — U071 COVID-19: Secondary | ICD-10-CM | POA: Diagnosis not present

## 2020-02-21 DIAGNOSIS — I5022 Chronic systolic (congestive) heart failure: Secondary | ICD-10-CM | POA: Diagnosis not present

## 2020-02-21 DIAGNOSIS — J9621 Acute and chronic respiratory failure with hypoxia: Secondary | ICD-10-CM | POA: Diagnosis not present

## 2020-02-21 LAB — BASIC METABOLIC PANEL
Anion gap: 11 (ref 5–15)
BUN: 55 mg/dL — ABNORMAL HIGH (ref 8–23)
CO2: 28 mmol/L (ref 22–32)
Calcium: 8.3 mg/dL — ABNORMAL LOW (ref 8.9–10.3)
Chloride: 108 mmol/L (ref 98–111)
Creatinine, Ser: 0.73 mg/dL (ref 0.61–1.24)
GFR calc Af Amer: >60 mL/min (ref >60)
GFR calc non Af Amer: >60 mL/min (ref >60)
Glucose, Bld: 118 mg/dL — ABNORMAL HIGH (ref 70–99)
Potassium: 4.6 mmol/L (ref 3.5–5.1)
Sodium: 147 mmol/L — ABNORMAL HIGH (ref 135–145)

## 2020-02-21 LAB — CBC
HCT: 34.7 % — ABNORMAL LOW (ref 39.0–52.0)
Hemoglobin: 10.7 g/dL — ABNORMAL LOW (ref 13.0–17.0)
MCH: 31.4 pg (ref 26.0–34.0)
MCHC: 30.8 g/dL (ref 30.0–36.0)
MCV: 101.8 fL — ABNORMAL HIGH (ref 80.0–100.0)
Platelets: 206 10*3/uL (ref 150–400)
RBC: 3.41 MIL/uL — ABNORMAL LOW (ref 4.22–5.81)
RDW: 20.3 % — ABNORMAL HIGH (ref 11.5–15.5)
WBC: 10.8 10*3/uL — ABNORMAL HIGH (ref 4.0–10.5)
nRBC: 0.2 % (ref 0.0–0.2)

## 2020-02-21 NOTE — Progress Notes (Signed)
Pulmonary Critical Care Medicine [facility] [place]   PULMONARY CRITICAL CARE SERVICE  PROGRESS NOTE  Date of Service: 02/21/2020  Jason Knox  [id number]  DOB: [birth year]   DOA: 02/18/2020  Referring Physician: Carron Curie, MD  HPI: Jason Knox is a 69 y.o. male seen for follow up of Acute on Chronic Respiratory Failure.  Patient currently is on the NAG on 3 L goal of 16 hours  Medications: Reviewed on Rounds  Physical Exam:  Vitals: Temperature 96.4 pulse 63 respiratory 22 blood pressure is 124/73 saturations 100%  Ventilator Settings off the ventilator on the NAG  . General: Comfortable at this time . Eyes: Grossly normal lids, irises & conjunctiva . ENT: grossly tongue is normal . Neck: no obvious mass . Cardiovascular: S1 S2 normal no gallop . Respiratory: No rhonchi no rales are noted at this time . Abdomen: soft . Skin: no rash seen on limited exam . Musculoskeletal: not rigid . Psychiatric:unable to assess . Neurologic: no seizure no involuntary movements         Lab Data:   Basic Metabolic Panel: Recent Labs  Lab 02/15/20 0711 02/17/20 0700 02/19/20 0218 02/21/20 0610  NA 147* 147* 148* 147*  K 4.0 4.9 5.0 4.6  CL 109 110 111 108  CO2 29 28 27 28   GLUCOSE 152* 241* 166* 118*  BUN 55* 62* 69* 55*  CREATININE 0.70 0.81 0.74 0.73  CALCIUM 8.7* 8.8* 8.8* 8.3*  MG 2.4  --   --   --     ABG: No results for input(s): PHART, PCO2ART, PO2ART, HCO3, O2SAT in the last 168 hours.  Liver Function Tests: No results for input(s): AST, ALT, ALKPHOS, BILITOT, PROT, ALBUMIN in the last 168 hours. No results for input(s): LIPASE, AMYLASE in the last 168 hours. No results for input(s): AMMONIA in the last 168 hours.  CBC: Recent Labs  Lab 02/18/20 1509 02/18/20 2019 02/19/20 0218 02/19/20 0746 02/21/20 0610  WBC 10.5 9.6 10.7* 10.2 10.8*  HGB 11.2* 10.7* 11.7* 11.4* 10.7*  HCT 36.2* 34.5* 38.4* 37.1* 34.7*  MCV 100.3* 98.6 101.6*  101.9* 101.8*  PLT 268 250 238 249 206    Cardiac Enzymes: No results for input(s): CKTOTAL, CKMB, CKMBINDEX, TROPONINI in the last 168 hours.  BNP (last 3 results) No results for input(s): BNP in the last 8760 hours.  ProBNP (last 3 results) No results for input(s): PROBNP in the last 8760 hours.  Radiological Exams: No results found.  Assessment/Plan Active Problems:   Acute on chronic respiratory failure with hypoxia (HCC)   COVID-19 virus infection   Acute arterial ischemic stroke, multifocal, multiple vascular territories [facility])   Seizure disorder (HCC)   Chronic systolic heart failure (HCC)   1. Acute on chronic respiratory failure with hypoxia plan is to continue with the NAG titrate oxygen continue pulmonary toilet goal of 16 hours 2. COVID-19 virus infection in recovery phase we will continue to monitor 3. Acute stroke no change 4. Seizure disorder no active seizures 5. Chronic systolic heart failure appears to be compensated we will continue to monitor fluid status   I have personally seen and evaluated the patient, evaluated laboratory and imaging results, formulated the assessment and plan and placed orders. The Patient requires high complexity decision making with multiple systems involvement.  Rounds were done with the Respiratory Therapy Director and Staff therapists and discussed with nursing staff also.  [facility], MD [facility] Pulmonary Critical Care Medicine Sleep Medicine

## 2020-02-22 DIAGNOSIS — J9621 Acute and chronic respiratory failure with hypoxia: Secondary | ICD-10-CM | POA: Diagnosis not present

## 2020-02-22 DIAGNOSIS — U071 COVID-19: Secondary | ICD-10-CM | POA: Diagnosis not present

## 2020-02-22 DIAGNOSIS — I6389 Other cerebral infarction: Secondary | ICD-10-CM | POA: Diagnosis not present

## 2020-02-22 DIAGNOSIS — I5022 Chronic systolic (congestive) heart failure: Secondary | ICD-10-CM | POA: Diagnosis not present

## 2020-02-22 NOTE — Progress Notes (Signed)
Pulmonary Critical Care Medicine [facility] [place]   PULMONARY CRITICAL CARE SERVICE  PROGRESS NOTE  Date of Service: 02/22/2020  Jason Knox  [id number]  DOB: [birth year]   DOA: March 01, 2020  Referring Physician: Carron Curie, MD  HPI: Jason Knox is a 69 y.o. male seen for follow up of Acute on Chronic Respiratory Failure.  Patient continues to wean on the nag currently is on 2 L with a goal of 20 hours  Medications: Reviewed on Rounds  Physical Exam:  Vitals: Temperature is 96.7 pulse 53 respiratory 27 blood pressure is 109/71 saturations 98%  Ventilator Settings on NAG on 2 L  . General: Comfortable at this time . Eyes: Grossly normal lids, irises & conjunctiva . ENT: grossly tongue is normal . Neck: no obvious mass . Cardiovascular: S1 S2 normal no gallop . Respiratory: No rhonchi no rales are noted at this time . Abdomen: soft . Skin: no rash seen on limited exam . Musculoskeletal: not rigid . Psychiatric:unable to assess . Neurologic: no seizure no involuntary movements         Lab Data:   Basic Metabolic Panel: Recent Labs  Lab 02/17/20 0700 02/19/20 0218 02/21/20 0610  NA 147* 148* 147*  K 4.9 5.0 4.6  CL 110 111 108  CO2 28 27 28   GLUCOSE 241* 166* 118*  BUN 62* 69* 55*  CREATININE 0.81 0.74 0.73  CALCIUM 8.8* 8.8* 8.3*    ABG: No results for input(s): PHART, PCO2ART, PO2ART, HCO3, O2SAT in the last 168 hours.  Liver Function Tests: No results for input(s): AST, ALT, ALKPHOS, BILITOT, PROT, ALBUMIN in the last 168 hours. No results for input(s): LIPASE, AMYLASE in the last 168 hours. No results for input(s): AMMONIA in the last 168 hours.  CBC: Recent Labs  Lab 02/18/20 1509 02/18/20 2019 02/19/20 0218 02/19/20 0746 02/21/20 0610  WBC 10.5 9.6 10.7* 10.2 10.8*  HGB 11.2* 10.7* 11.7* 11.4* 10.7*  HCT 36.2* 34.5* 38.4* 37.1* 34.7*  MCV 100.3* 98.6 101.6* 101.9* 101.8*  PLT 268 250 238 249 206    Cardiac Enzymes: No  results for input(s): CKTOTAL, CKMB, CKMBINDEX, TROPONINI in the last 168 hours.  BNP (last 3 results) No results for input(s): BNP in the last 8760 hours.  ProBNP (last 3 results) No results for input(s): PROBNP in the last 8760 hours.  Radiological Exams: No results found.  Assessment/Plan Active Problems:   Acute on chronic respiratory failure with hypoxia (HCC)   COVID-19 virus infection   Acute arterial ischemic stroke, multifocal, multiple vascular territories [facility])   Seizure disorder (HCC)   Chronic systolic heart failure (HCC)   1. Acute on chronic respiratory failure with hypoxia we will continue with the weaning process goal of 20 hours 2. COVID-19 virus infection at baseline continue with supportive care 3. Acute stroke no change we will continue to follow 4. Seizure disorder no active seizure noted 5. Chronic systolic heart failure compensated at baseline   I have personally seen and evaluated the patient, evaluated laboratory and imaging results, formulated the assessment and plan and placed orders. The Patient requires high complexity decision making with multiple systems involvement.  Rounds were done with the Respiratory Therapy Director and Staff therapists and discussed with nursing staff also.  [facility], MD [facility] Pulmonary Critical Care Medicine Sleep Medicine

## 2020-02-23 DIAGNOSIS — I5022 Chronic systolic (congestive) heart failure: Secondary | ICD-10-CM | POA: Diagnosis not present

## 2020-02-23 DIAGNOSIS — I6389 Other cerebral infarction: Secondary | ICD-10-CM | POA: Diagnosis not present

## 2020-02-23 DIAGNOSIS — U071 COVID-19: Secondary | ICD-10-CM | POA: Diagnosis not present

## 2020-02-23 DIAGNOSIS — J9621 Acute and chronic respiratory failure with hypoxia: Secondary | ICD-10-CM | POA: Diagnosis not present

## 2020-02-23 NOTE — Progress Notes (Signed)
Pulmonary Critical Care Medicine [facility] [place]   PULMONARY CRITICAL CARE SERVICE  PROGRESS NOTE  Date of Service: 02/23/2020  Jason Knox  [id number]  DOB: 04-29-1951   DOA: 02/12/2020  Referring Physician: Carron Curie, MD  HPI: Jason Knox is a 69 y.o. male seen for follow up of Acute on Chronic Respiratory Failure.  Patient is weaning off the ventilator on the NAG currently requiring 2 L of O2 will be completing 24 hours  Medications: Reviewed on Rounds  Physical Exam:  Vitals: Temperature 96.9 pulse 51 respiratory 18 blood pressure is 119/60 saturations 98%  Ventilator Settings off the ventilator on the NAG on 2 L O2  . General: Comfortable at this time . Eyes: Grossly normal lids, irises & conjunctiva . ENT: grossly tongue is normal . Neck: no obvious mass . Cardiovascular: S1 S2 normal no gallop . Respiratory: Scattered rhonchi expansion is equal . Abdomen: soft . Skin: no rash seen on limited exam . Musculoskeletal: not rigid . Psychiatric:unable to assess . Neurologic: no seizure no involuntary movements         Lab Data:   Basic Metabolic Panel: Recent Labs  Lab 02/17/20 0700 02/19/20 0218 02/21/20 0610  NA 147* 148* 147*  K 4.9 5.0 4.6  CL 110 111 108  CO2 28 27 28   GLUCOSE 241* 166* 118*  BUN 62* 69* 55*  CREATININE 0.81 0.74 0.73  CALCIUM 8.8* 8.8* 8.3*    ABG: No results for input(s): PHART, PCO2ART, PO2ART, HCO3, O2SAT in the last 168 hours.  Liver Function Tests: No results for input(s): AST, ALT, ALKPHOS, BILITOT, PROT, ALBUMIN in the last 168 hours. No results for input(s): LIPASE, AMYLASE in the last 168 hours. No results for input(s): AMMONIA in the last 168 hours.  CBC: Recent Labs  Lab 02/18/20 1509 02/18/20 2019 02/19/20 0218 02/19/20 0746 02/21/20 0610  WBC 10.5 9.6 10.7* 10.2 10.8*  HGB 11.2* 10.7* 11.7* 11.4* 10.7*  HCT 36.2* 34.5* 38.4* 37.1* 34.7*  MCV 100.3* 98.6 101.6* 101.9* 101.8*  PLT 268  250 238 249 206    Cardiac Enzymes: No results for input(s): CKTOTAL, CKMB, CKMBINDEX, TROPONINI in the last 168 hours.  BNP (last 3 results) No results for input(s): BNP in the last 8760 hours.  ProBNP (last 3 results) No results for input(s): PROBNP in the last 8760 hours.  Radiological Exams: No results found.  Assessment/Plan Active Problems:   Acute on chronic respiratory failure with hypoxia (HCC)   COVID-19 virus infection   Acute arterial ischemic stroke, multifocal, multiple vascular territories [facility])   Seizure disorder (HCC)   Chronic systolic heart failure (HCC)   1. Acute on chronic respiratory failure with hypoxia we will continue with the NAG on 2 L of oxygen currently is on a goal of 24 hours 2. COVID-19 virus infection treated we will continue with supportive care 3. Acute ischemic stroke no change we will continue to follow 4. Seizure disorder patient is at baseline 5. Chronic systolic heart failure compensated   I have personally seen and evaluated the patient, evaluated laboratory and imaging results, formulated the assessment and plan and placed orders. The Patient requires high complexity decision making with multiple systems involvement.  Rounds were done with the Respiratory Therapy Director and Staff therapists and discussed with nursing staff also.  [facility], MD [facility] Pulmonary Critical Care Medicine Sleep Medicine

## 2020-02-24 DIAGNOSIS — U071 COVID-19: Secondary | ICD-10-CM | POA: Diagnosis not present

## 2020-02-24 DIAGNOSIS — I5022 Chronic systolic (congestive) heart failure: Secondary | ICD-10-CM | POA: Diagnosis not present

## 2020-02-24 DIAGNOSIS — J9621 Acute and chronic respiratory failure with hypoxia: Secondary | ICD-10-CM | POA: Diagnosis not present

## 2020-02-24 DIAGNOSIS — I6389 Other cerebral infarction: Secondary | ICD-10-CM | POA: Diagnosis not present

## 2020-02-24 NOTE — Progress Notes (Signed)
Pulmonary Critical Care Medicine [facility] [place]   PULMONARY CRITICAL CARE SERVICE  PROGRESS NOTE  Date of Service: 02/24/2020  Jason Knox  [id number]  DOB: 1951-07-01   DOA: 02/16/2020  Referring Physician: Carron Curie, MD  HPI: Jason Knox is a 69 y.o. male seen for follow up of Acute on Chronic Respiratory Failure.  Continues to do fine off the ventilator on the NAG currently is requiring 2 L  Medications: Reviewed on Rounds  Physical Exam:  Vitals: Temperature 96.1 pulse 70 respiratory 25 blood pressures 125/72 saturations 97%  Ventilator Settings on the NAG on 2 L of O2 going 20-hour  . General: Comfortable at this time . Eyes: Grossly normal lids, irises & conjunctiva . ENT: grossly tongue is normal . Neck: no obvious mass . Cardiovascular: S1 S2 normal no gallop . Respiratory: No rhonchi coarse breath sounds . Abdomen: soft . Skin: no rash seen on limited exam . Musculoskeletal: not rigid . Psychiatric:unable to assess . Neurologic: no seizure no involuntary movements         Lab Data:   Basic Metabolic Panel: Recent Labs  Lab 02/19/20 0218 02/21/20 0610  NA 148* 147*  K 5.0 4.6  CL 111 108  CO2 27 28  GLUCOSE 166* 118*  BUN 69* 55*  CREATININE 0.74 0.73  CALCIUM 8.8* 8.3*    ABG: No results for input(s): PHART, PCO2ART, PO2ART, HCO3, O2SAT in the last 168 hours.  Liver Function Tests: No results for input(s): AST, ALT, ALKPHOS, BILITOT, PROT, ALBUMIN in the last 168 hours. No results for input(s): LIPASE, AMYLASE in the last 168 hours. No results for input(s): AMMONIA in the last 168 hours.  CBC: Recent Labs  Lab 02/18/20 1509 02/18/20 2019 02/19/20 0218 02/19/20 0746 02/21/20 0610  WBC 10.5 9.6 10.7* 10.2 10.8*  HGB 11.2* 10.7* 11.7* 11.4* 10.7*  HCT 36.2* 34.5* 38.4* 37.1* 34.7*  MCV 100.3* 98.6 101.6* 101.9* 101.8*  PLT 268 250 238 249 206    Cardiac Enzymes: No results for input(s): CKTOTAL, CKMB,  CKMBINDEX, TROPONINI in the last 168 hours.  BNP (last 3 results) No results for input(s): BNP in the last 8760 hours.  ProBNP (last 3 results) No results for input(s): PROBNP in the last 8760 hours.  Radiological Exams: No results found.  Assessment/Plan Active Problems:   Acute on chronic respiratory failure with hypoxia (HCC)   COVID-19 virus infection   Acute arterial ischemic stroke, multifocal, multiple vascular territories [facility])   Seizure disorder (HCC)   Chronic systolic heart failure (HCC)   1. Acute on chronic respiratory failure with hypoxia continue with the NAG 20-hour goal 2. COVID-19 virus infection resolving 3. ARDS resolving 4. Acute stroke no change supportive care 5. Seizure disorder no active seizure 6. Chronic systolic heart failure compensated at baseline   I have personally seen and evaluated the patient, evaluated laboratory and imaging results, formulated the assessment and plan and placed orders. The Patient requires high complexity decision making with multiple systems involvement.  Rounds were done with the Respiratory Therapy Director and Staff therapists and discussed with nursing staff also.  Yevonne Pax, MD [facility] Pulmonary Critical Care Medicine Sleep Medicine

## 2020-02-25 DIAGNOSIS — U071 COVID-19: Secondary | ICD-10-CM | POA: Diagnosis not present

## 2020-02-25 DIAGNOSIS — I6389 Other cerebral infarction: Secondary | ICD-10-CM | POA: Diagnosis not present

## 2020-02-25 DIAGNOSIS — J9621 Acute and chronic respiratory failure with hypoxia: Secondary | ICD-10-CM | POA: Diagnosis not present

## 2020-02-25 DIAGNOSIS — I5022 Chronic systolic (congestive) heart failure: Secondary | ICD-10-CM | POA: Diagnosis not present

## 2020-02-25 NOTE — Progress Notes (Signed)
Pulmonary Critical Care Medicine [facility] [place]   PULMONARY CRITICAL CARE SERVICE  PROGRESS NOTE  Date of Service: 02/25/2020  Jason Knox  IWP:[id number]  DOB: 03/13/1951   DOA: 02/29/2020  Referring Physician: Carron Curie, MD  HPI: Jason Knox is a 69 y.o. male seen for follow up of Acute on Chronic Respiratory Failure.  Completing 24 hours on the NAG doing well so far  Medications: Reviewed on Rounds  Physical Exam:  Vitals: Temperature is 96.0 pulse 73 respiratory 27 blood pressure is 165/89 saturations 99%  Ventilator Settings off ventilator on the NAG  . General: Comfortable at this time . Eyes: Grossly normal lids, irises & conjunctiva . ENT: grossly tongue is normal . Neck: no obvious mass . Cardiovascular: S1 S2 normal no gallop . Respiratory: No rhonchi no rales are noted at this time . Abdomen: soft . Skin: no rash seen on limited exam . Musculoskeletal: not rigid . Psychiatric:unable to assess . Neurologic: no seizure no involuntary movements         Lab Data:   Basic Metabolic Panel: Recent Labs  Lab 02/19/20 0218 02/21/20 0610  NA 148* 147*  K 5.0 4.6  CL 111 108  CO2 27 28  GLUCOSE 166* 118*  BUN 69* 55*  CREATININE 0.74 0.73  CALCIUM 8.8* 8.3*    ABG: No results for input(s): PHART, PCO2ART, PO2ART, HCO3, O2SAT in the last 168 hours.  Liver Function Tests: No results for input(s): AST, ALT, ALKPHOS, BILITOT, PROT, ALBUMIN in the last 168 hours. No results for input(s): LIPASE, AMYLASE in the last 168 hours. No results for input(s): AMMONIA in the last 168 hours.  CBC: Recent Labs  Lab 02/18/20 1509 02/18/20 2019 02/19/20 0218 02/19/20 0746 02/21/20 0610  WBC 10.5 9.6 10.7* 10.2 10.8*  HGB 11.2* 10.7* 11.7* 11.4* 10.7*  HCT 36.2* 34.5* 38.4* 37.1* 34.7*  MCV 100.3* 98.6 101.6* 101.9* 101.8*  PLT 268 250 238 249 206    Cardiac Enzymes: No results for input(s): CKTOTAL, CKMB, CKMBINDEX, TROPONINI in the last  168 hours.  BNP (last 3 results) No results for input(s): BNP in the last 8760 hours.  ProBNP (last 3 results) No results for input(s): PROBNP in the last 8760 hours.  Radiological Exams: No results found.  Assessment/Plan Active Problems:   Acute on chronic respiratory failure with hypoxia (HCC)   COVID-19 virus infection   Acute arterial ischemic stroke, multifocal, multiple vascular territories [facility])   Seizure disorder (HCC)   Chronic systolic heart failure (HCC)   1. Acute on chronic respiratory failure hypoxia continue to wean on NAG titrate oxygen continue pulmonary toilet 2. COVID-19 virus infection resolved 3. Acute stroke no change we will continue to follow 4. Seizure disorder at baseline we will continue with supportive care no active seizures 5. Chronic systolic heart failure compensated we will monitor fluid status   I have personally seen and evaluated the patient, evaluated laboratory and imaging results, formulated the assessment and plan and placed orders. The Patient requires high complexity decision making with multiple systems involvement.  Rounds were done with the Respiratory Therapy Director and Staff therapists and discussed with nursing staff also.  Yevonne Pax, MD [facility] Pulmonary Critical Care Medicine Sleep Medicine

## 2020-02-26 DIAGNOSIS — U071 COVID-19: Secondary | ICD-10-CM | POA: Diagnosis not present

## 2020-02-26 DIAGNOSIS — I6389 Other cerebral infarction: Secondary | ICD-10-CM | POA: Diagnosis not present

## 2020-02-26 DIAGNOSIS — I5022 Chronic systolic (congestive) heart failure: Secondary | ICD-10-CM | POA: Diagnosis not present

## 2020-02-26 DIAGNOSIS — J9621 Acute and chronic respiratory failure with hypoxia: Secondary | ICD-10-CM | POA: Diagnosis not present

## 2020-02-26 NOTE — Progress Notes (Addendum)
Pulmonary Critical Care Medicine [facility] [place]   PULMONARY CRITICAL CARE SERVICE  PROGRESS NOTE  Date of Service: 02/26/2020  Jason Knox  YIR:[id number]  DOB: 05/01/1951   DOA: 02/19/2020  Referring Physician: Carron Curie, MD  HPI: Jason Knox is a 69 y.o. male seen for follow up of Acute on Chronic Respiratory Failure.  Patient remains on nag at this time for 48-hour goal on 1 L.  Satting well no distress.  Medications: Reviewed on Rounds  Physical Exam:  Vitals: Pulse 61 respiration 21 BP 159/85 O2 sat 92% temp 97.6  Ventilator Settings NAG 1 L  . General: Comfortable at this time . Eyes: Grossly normal lids, irises & conjunctiva . ENT: grossly tongue is normal . Neck: no obvious mass . Cardiovascular: S1 S2 normal no gallop . Respiratory: Coarse breath sounds . Abdomen: soft . Skin: no rash seen on limited exam . Musculoskeletal: not rigid . Psychiatric:unable to assess . Neurologic: no seizure no involuntary movements         Lab Data:   Basic Metabolic Panel: Recent Labs  Lab 02/21/20 0610  NA 147*  K 4.6  CL 108  CO2 28  GLUCOSE 118*  BUN 55*  CREATININE 0.73  CALCIUM 8.3*    ABG: No results for input(s): PHART, PCO2ART, PO2ART, HCO3, O2SAT in the last 168 hours.  Liver Function Tests: No results for input(s): AST, ALT, ALKPHOS, BILITOT, PROT, ALBUMIN in the last 168 hours. No results for input(s): LIPASE, AMYLASE in the last 168 hours. No results for input(s): AMMONIA in the last 168 hours.  CBC: Recent Labs  Lab 02/21/20 0610  WBC 10.8*  HGB 10.7*  HCT 34.7*  MCV 101.8*  PLT 206    Cardiac Enzymes: No results for input(s): CKTOTAL, CKMB, CKMBINDEX, TROPONINI in the last 168 hours.  BNP (last 3 results) No results for input(s): BNP in the last 8760 hours.  ProBNP (last 3 results) No results for input(s): PROBNP in the last 8760 hours.  Radiological Exams: No results found.  Assessment/Plan Active  Problems:   Acute on chronic respiratory failure with hypoxia (HCC)   COVID-19 virus infection   Acute arterial ischemic stroke, multifocal, multiple vascular territories [facility])   Seizure disorder (HCC)   Chronic systolic heart failure (HCC)   1. Acute on chronic respiratory failure hypoxia continue to wean on NAG titrate oxygen continue pulmonary toilet 2. COVID-19 virus infection resolved 3. Acute stroke no change we will continue to follow 4. Seizure disorder at baseline we will continue with supportive care no active seizures 5. Chronic systolic heart failure compensated we will monitor fluid status   I have personally seen and evaluated the patient, evaluated laboratory and imaging results, formulated the assessment and plan and placed orders. The Patient requires high complexity decision making with multiple systems involvement.  Rounds were done with the Respiratory Therapy Director and Staff therapists and discussed with nursing staff also.  Yevonne Pax, MD [facility] Pulmonary Critical Care Medicine Sleep Medicine

## 2020-02-27 DIAGNOSIS — I6389 Other cerebral infarction: Secondary | ICD-10-CM | POA: Diagnosis not present

## 2020-02-27 DIAGNOSIS — U071 COVID-19: Secondary | ICD-10-CM | POA: Diagnosis not present

## 2020-02-27 DIAGNOSIS — I5022 Chronic systolic (congestive) heart failure: Secondary | ICD-10-CM | POA: Diagnosis not present

## 2020-02-27 DIAGNOSIS — J9621 Acute and chronic respiratory failure with hypoxia: Secondary | ICD-10-CM | POA: Diagnosis not present

## 2020-02-27 LAB — LEVETIRACETAM LEVEL: Levetiracetam Lvl: 18.8 ug/mL (ref 10.0–40.0)

## 2020-02-27 NOTE — Progress Notes (Signed)
Pulmonary Critical Care Medicine [facility] [place]   PULMONARY CRITICAL CARE SERVICE  PROGRESS NOTE  Date of Service: 02/27/2020  Jason Knox  [id number]  DOB: 1951/03/12   DOA: 01/25/2020  Referring Physician: Carron Curie, MD  HPI: Jason Knox is a 69 y.o. male seen for follow up of Acute on Chronic Respiratory Failure.  Patient is off the ventilator on the NAG currently is on 1 L with a goal of 48 hours today  Medications: Reviewed on Rounds  Physical Exam:  Vitals: Temperature is 97.4 pulse 57 respiratory 35 blood pressure is 152/84 saturations 99%  Ventilator Settings off ventilator on the NAG on 1 L  . General: Comfortable at this time . Eyes: Grossly normal lids, irises & conjunctiva . ENT: grossly tongue is normal . Neck: no obvious mass . Cardiovascular: S1 S2 normal no gallop . Respiratory: No rhonchi coarse breath sounds . Abdomen: soft . Skin: no rash seen on limited exam . Musculoskeletal: not rigid . Psychiatric:unable to assess . Neurologic: no seizure no involuntary movements         Lab Data:   Basic Metabolic Panel: Recent Labs  Lab 02/21/20 0610  NA 147*  K 4.6  CL 108  CO2 28  GLUCOSE 118*  BUN 55*  CREATININE 0.73  CALCIUM 8.3*    ABG: No results for input(s): PHART, PCO2ART, PO2ART, HCO3, O2SAT in the last 168 hours.  Liver Function Tests: No results for input(s): AST, ALT, ALKPHOS, BILITOT, PROT, ALBUMIN in the last 168 hours. No results for input(s): LIPASE, AMYLASE in the last 168 hours. No results for input(s): AMMONIA in the last 168 hours.  CBC: Recent Labs  Lab 02/21/20 0610  WBC 10.8*  HGB 10.7*  HCT 34.7*  MCV 101.8*  PLT 206    Cardiac Enzymes: No results for input(s): CKTOTAL, CKMB, CKMBINDEX, TROPONINI in the last 168 hours.  BNP (last 3 results) No results for input(s): BNP in the last 8760 hours.  ProBNP (last 3 results) No results for input(s): PROBNP in the last 8760  hours.  Radiological Exams: No results found.  Assessment/Plan Active Problems:   Acute on chronic respiratory failure with hypoxia (HCC)   COVID-19 virus infection   Acute arterial ischemic stroke, multifocal, multiple vascular territories [facility])   Seizure disorder (HCC)   Chronic systolic heart failure (HCC)   1. Acute on chronic respiratory failure with hypoxia we will continue with NAG titrate oxygen continue pulmonary toilet 2. COVID-19 virus infection in recovery phase we will continue with supportive care 3. Seizure disorder no active seizures 4. Chronic systolic heart failure compensated we will continue with supportive care 5. Acute stroke no change supportive care   I have personally seen and evaluated the patient, evaluated laboratory and imaging results, formulated the assessment and plan and placed orders. The Patient requires high complexity decision making with multiple systems involvement.  Rounds were done with the Respiratory Therapy Director and Staff therapists and discussed with nursing staff also.  Yevonne Pax, MD [facility] Pulmonary Critical Care Medicine Sleep Medicine

## 2020-02-28 ENCOUNTER — Other Ambulatory Visit (HOSPITAL_COMMUNITY): Payer: Medicare HMO

## 2020-02-28 DIAGNOSIS — U071 COVID-19: Secondary | ICD-10-CM | POA: Diagnosis not present

## 2020-02-28 DIAGNOSIS — I6389 Other cerebral infarction: Secondary | ICD-10-CM | POA: Diagnosis not present

## 2020-02-28 DIAGNOSIS — J9621 Acute and chronic respiratory failure with hypoxia: Secondary | ICD-10-CM | POA: Diagnosis not present

## 2020-02-28 DIAGNOSIS — I5022 Chronic systolic (congestive) heart failure: Secondary | ICD-10-CM | POA: Diagnosis not present

## 2020-02-28 LAB — CBC
HCT: 43.4 % (ref 39.0–52.0)
Hemoglobin: 13 g/dL (ref 13.0–17.0)
MCH: 31.6 pg (ref 26.0–34.0)
MCHC: 30 g/dL (ref 30.0–36.0)
MCV: 105.6 fL — ABNORMAL HIGH (ref 80.0–100.0)
Platelets: 124 10*3/uL — ABNORMAL LOW (ref 150–400)
RBC: 4.11 MIL/uL — ABNORMAL LOW (ref 4.22–5.81)
RDW: 21.3 % — ABNORMAL HIGH (ref 11.5–15.5)
WBC: 13.2 10*3/uL — ABNORMAL HIGH (ref 4.0–10.5)
nRBC: 0 % (ref 0.0–0.2)

## 2020-02-28 LAB — BLOOD GAS, ARTERIAL
Acid-Base Excess: 12 mmol/L — ABNORMAL HIGH (ref 0.0–2.0)
Bicarbonate: 39.4 mmol/L — ABNORMAL HIGH (ref 20.0–28.0)
FIO2: 52
O2 Saturation: 97.6 %
Patient temperature: 36.7
pCO2 arterial: 91.3 mmHg (ref 32.0–48.0)
pH, Arterial: 7.256 — ABNORMAL LOW (ref 7.350–7.450)
pO2, Arterial: 122 mmHg — ABNORMAL HIGH (ref 83.0–108.0)

## 2020-02-28 LAB — BASIC METABOLIC PANEL
Anion gap: 9 (ref 5–15)
BUN: 101 mg/dL — ABNORMAL HIGH (ref 8–23)
CO2: 38 mmol/L — ABNORMAL HIGH (ref 22–32)
Calcium: 9.5 mg/dL (ref 8.9–10.3)
Chloride: 115 mmol/L — ABNORMAL HIGH (ref 98–111)
Creatinine, Ser: 0.8 mg/dL (ref 0.61–1.24)
GFR calc Af Amer: >60 mL/min (ref >60)
GFR calc non Af Amer: >60 mL/min (ref >60)
Glucose, Bld: 162 mg/dL — ABNORMAL HIGH (ref 70–99)
Potassium: 5.2 mmol/L — ABNORMAL HIGH (ref 3.5–5.1)
Sodium: 162 mmol/L (ref 135–145)

## 2020-02-28 LAB — LEVETIRACETAM LEVEL: Levetiracetam Lvl: 26.1 ug/mL (ref 10.0–40.0)

## 2020-02-28 NOTE — Progress Notes (Signed)
Pulmonary Critical Care Medicine [facility] [place]   PULMONARY CRITICAL CARE SERVICE  PROGRESS NOTE  Date of Service: 02/28/2020  Jason Knox  [id number]:[id number]  DOB: 12-08-1950   DOA: 03-07-2020  Referring Physician: Carron Curie, MD  HPI: Jason Knox is a 69 y.o. male seen for follow up of Acute on Chronic Respiratory Failure.  Patient currently is on assist control mode on 40% FiO2 with a PEEP of 5  Medications: Reviewed on Rounds  Physical Exam:  Vitals: Temperature is 97.3 pulse 56 respiratory rate is 34 blood pressure is 160/78 saturations 98%  Ventilator Settings on assist control FiO2 is 40% tidal volume is 450 with a PEEP of 5  . General: Comfortable at this time . Eyes: Grossly normal lids, irises & conjunctiva . ENT: grossly tongue is normal . Neck: no obvious mass . Cardiovascular: S1 S2 normal no gallop . Respiratory: No rhonchi coarse breath sounds are noted at this time . Abdomen: soft . Skin: no rash seen on limited exam . Musculoskeletal: not rigid . Psychiatric:unable to assess . Neurologic: no seizure no involuntary movements         Lab Data:   Basic Metabolic Panel: Recent Labs  Lab 02/28/20 0514  NA 162*  K 5.2*  CL 115*  CO2 38*  GLUCOSE 162*  BUN 101*  CREATININE 0.80  CALCIUM 9.5    ABG: Recent Labs  Lab 02/28/20 0730  PHART 7.256*  PCO2ART 91.3*  PO2ART 122*  HCO3 39.4*  O2SAT 97.6    Liver Function Tests: No results for input(s): AST, ALT, ALKPHOS, BILITOT, PROT, ALBUMIN in the last 168 hours. No results for input(s): LIPASE, AMYLASE in the last 168 hours. No results for input(s): AMMONIA in the last 168 hours.  CBC: Recent Labs  Lab 02/28/20 0514  WBC 13.2*  HGB 13.0  HCT 43.4  MCV 105.6*  PLT 124*    Cardiac Enzymes: No results for input(s): CKTOTAL, CKMB, CKMBINDEX, TROPONINI in the last 168 hours.  BNP (last 3 results) No results for input(s): BNP in the last 8760 hours.  ProBNP (last 3  results) No results for input(s): PROBNP in the last 8760 hours.  Radiological Exams: DG Chest Port 1 View  Result Date: 02/28/2020 CLINICAL DATA:  Respiratory failure.  COVID. EXAM: PORTABLE CHEST 1 VIEW COMPARISON:  02/13/2020. FINDINGS: Interim removal of right PICC line. Tracheostomy tube in stable position. Prior median sternotomy. Cardiomegaly. Diffuse bilateral pulmonary infiltrates/edema noted on today's exam. Bilateral pleural effusions appears stable. No pneumothorax. IMPRESSION: 1. Interim removal of right PICC line. Tracheostomy tube in stable position. 2. Prior median sternotomy. Cardiomegaly. Diffuse bilateral pulmonary infiltrates/edema noted on today's exam. Bilateral pleural effusions appear stable. Electronically Signed   By: Maisie Fus  Register   On: 02/28/2020 07:25    Assessment/Plan Active Problems:   Acute on chronic respiratory failure with hypoxia (HCC)   COVID-19 virus infection   Acute arterial ischemic stroke, multifocal, multiple vascular territories [facility])   Seizure disorder (HCC)   Chronic systolic heart failure (HCC)   1. Acute on chronic respiratory failure with hypoxia plan is to continue with full support on assist control mode titrate oxygen down as tolerated. 2. COVID-19 virus infection resolved we will continue with supportive care 3. Acute stroke no change supportive care 4. Seizure disorder patient has no active seizures 5. Chronic systolic heart failure is at baseline we will continue to monitor   I have personally seen and evaluated the patient, evaluated laboratory and imaging results, formulated  the assessment and plan and placed orders. The Patient requires high complexity decision making with multiple systems involvement.  Rounds were done with the Respiratory Therapy Director and Staff therapists and discussed with nursing staff also.  Allyne Gee, MD [facility] Pulmonary Critical Care Medicine Sleep Medicine

## 2020-02-29 DIAGNOSIS — U071 COVID-19: Secondary | ICD-10-CM | POA: Diagnosis not present

## 2020-02-29 DIAGNOSIS — J9621 Acute and chronic respiratory failure with hypoxia: Secondary | ICD-10-CM | POA: Diagnosis not present

## 2020-02-29 DIAGNOSIS — I6389 Other cerebral infarction: Secondary | ICD-10-CM | POA: Diagnosis not present

## 2020-02-29 DIAGNOSIS — I5022 Chronic systolic (congestive) heart failure: Secondary | ICD-10-CM | POA: Diagnosis not present

## 2020-02-29 LAB — BASIC METABOLIC PANEL
Anion gap: 8 (ref 5–15)
BUN: 107 mg/dL — ABNORMAL HIGH (ref 8–23)
CO2: 36 mmol/L — ABNORMAL HIGH (ref 22–32)
Calcium: 8.8 mg/dL — ABNORMAL LOW (ref 8.9–10.3)
Chloride: 112 mmol/L — ABNORMAL HIGH (ref 98–111)
Creatinine, Ser: 0.75 mg/dL (ref 0.61–1.24)
GFR calc Af Amer: >60 mL/min (ref >60)
GFR calc non Af Amer: >60 mL/min (ref >60)
Glucose, Bld: 146 mg/dL — ABNORMAL HIGH (ref 70–99)
Potassium: 4.2 mmol/L (ref 3.5–5.1)
Sodium: 156 mmol/L — ABNORMAL HIGH (ref 135–145)

## 2020-02-29 NOTE — Progress Notes (Signed)
Pulmonary Critical Care Medicine [facility] [place]   PULMONARY CRITICAL CARE SERVICE  PROGRESS NOTE  Date of Service: 02/29/2020  Jason Knox  FUX:[id number]  DOB: [birth year]   DOA: 01/29/2020  Referring Physician: Carron Curie, MD  HPI: Jason Knox is a 69 y.o. male seen for follow up of Acute on Chronic Respiratory Failure.  Currently is on assist control on 30% FiO2 with a PEEP of 5  Medications: Reviewed on Rounds  Physical Exam:  Vitals: Temperature is 96.8 pulse 63 respiratory rate 20 blood pressure is 126/65 saturations 100%  Ventilator Settings mode ventilation assist control FiO2 30% tidal volume 387 PEEP 5  . General: Comfortable at this time . Eyes: Grossly normal lids, irises & conjunctiva . ENT: grossly tongue is normal . Neck: no obvious mass . Cardiovascular: S1 S2 normal no gallop . Respiratory: Coarse breath sounds with few scattered rhonchi . Abdomen: soft . Skin: no rash seen on limited exam . Musculoskeletal: not rigid . Psychiatric:unable to assess . Neurologic: no seizure no involuntary movements         Lab Data:   Basic Metabolic Panel: Recent Labs  Lab 02/28/20 0514 02/29/20 0548  NA 162* 156*  K 5.2* 4.2  CL 115* 112*  CO2 38* 36*  GLUCOSE 162* 146*  BUN 101* 107*  CREATININE 0.80 0.75  CALCIUM 9.5 8.8*    ABG: Recent Labs  Lab 02/28/20 0730  PHART 7.256*  PCO2ART 91.3*  PO2ART 122*  HCO3 39.4*  O2SAT 97.6    Liver Function Tests: No results for input(s): AST, ALT, ALKPHOS, BILITOT, PROT, ALBUMIN in the last 168 hours. No results for input(s): LIPASE, AMYLASE in the last 168 hours. No results for input(s): AMMONIA in the last 168 hours.  CBC: Recent Labs  Lab 02/28/20 0514  WBC 13.2*  HGB 13.0  HCT 43.4  MCV 105.6*  PLT 124*    Cardiac Enzymes: No results for input(s): CKTOTAL, CKMB, CKMBINDEX, TROPONINI in the last 168 hours.  BNP (last 3 results) No results for input(s): BNP in the last 8760  hours.  ProBNP (last 3 results) No results for input(s): PROBNP in the last 8760 hours.  Radiological Exams: DG Chest Port 1 View  Result Date: 02/28/2020 CLINICAL DATA:  Respiratory failure.  COVID. EXAM: PORTABLE CHEST 1 VIEW COMPARISON:  02/13/2020. FINDINGS: Interim removal of right PICC line. Tracheostomy tube in stable position. Prior median sternotomy. Cardiomegaly. Diffuse bilateral pulmonary infiltrates/edema noted on today's exam. Bilateral pleural effusions appears stable. No pneumothorax. IMPRESSION: 1. Interim removal of right PICC line. Tracheostomy tube in stable position. 2. Prior median sternotomy. Cardiomegaly. Diffuse bilateral pulmonary infiltrates/edema noted on today's exam. Bilateral pleural effusions appear stable. Electronically Signed   By: Maisie Fus  Register   On: 02/28/2020 07:25    Assessment/Plan Active Problems:   Acute on chronic respiratory failure with hypoxia (HCC)   COVID-19 virus infection   Acute arterial ischemic stroke, multifocal, multiple vascular territories [facility])   Seizure disorder (HCC)   Chronic systolic heart failure (HCC)   1. Acute on chronic respiratory failure hypoxia plan is to continue with full support on assist control mode titrate oxygen continue pulmonary toilet. 2. COVID-19 virus infection at baseline 3. Acute ischemic stroke no change we will continue to monitor 4. Seizure disorder no active seizure noted at this time. 5. Chronic systolic heart failure baseline we will continue to follow   I have personally seen and evaluated the patient, evaluated laboratory and imaging results, formulated the assessment  and plan and placed orders. The Patient requires high complexity decision making with multiple systems involvement.  Rounds were done with the Respiratory Therapy Director and Staff therapists and discussed with nursing staff also.  Allyne Gee, MD [facility] Pulmonary Critical Care Medicine Sleep Medicine

## 2020-03-01 DIAGNOSIS — J9621 Acute and chronic respiratory failure with hypoxia: Secondary | ICD-10-CM | POA: Diagnosis not present

## 2020-03-01 DIAGNOSIS — U071 COVID-19: Secondary | ICD-10-CM | POA: Diagnosis not present

## 2020-03-01 DIAGNOSIS — I6389 Other cerebral infarction: Secondary | ICD-10-CM | POA: Diagnosis not present

## 2020-03-01 DIAGNOSIS — I5022 Chronic systolic (congestive) heart failure: Secondary | ICD-10-CM | POA: Diagnosis not present

## 2020-03-01 NOTE — Progress Notes (Signed)
Pulmonary Critical Care Medicine [facility] [place]   PULMONARY CRITICAL CARE SERVICE  PROGRESS NOTE  Date of Service: 03/01/2020  Jason Knox  [id number]  DOB: 1951-07-13   DOA: 01/31/2020  Referring Physician: Carron Curie, MD  HPI: Jason Knox is a 69 y.o. male seen for follow up of Acute on Chronic Respiratory Failure.  Patient currently is on pressure support has been on 30% FiO2 the goal today is for 2 hours  Medications: Reviewed on Rounds  Physical Exam:  Vitals: Temperature is 96.4 pulse 51 respiratory 23 blood pressure is 119/74 saturations 100%  Ventilator Settings on pressure support FiO2 30% pressure 12/5  . General: Comfortable at this time . Eyes: Grossly normal lids, irises & conjunctiva . ENT: grossly tongue is normal . Neck: no obvious mass . Cardiovascular: S1 S2 normal no gallop . Respiratory: No rhonchi no rales are noted at this time . Abdomen: soft . Skin: no rash seen on limited exam . Musculoskeletal: not rigid . Psychiatric:unable to assess . Neurologic: no seizure no involuntary movements         Lab Data:   Basic Metabolic Panel: Recent Labs  Lab 02/28/20 0514 02/29/20 0548  NA 162* 156*  K 5.2* 4.2  CL 115* 112*  CO2 38* 36*  GLUCOSE 162* 146*  BUN 101* 107*  CREATININE 0.80 0.75  CALCIUM 9.5 8.8*    ABG: Recent Labs  Lab 02/28/20 0730  PHART 7.256*  PCO2ART 91.3*  PO2ART 122*  HCO3 39.4*  O2SAT 97.6    Liver Function Tests: No results for input(s): AST, ALT, ALKPHOS, BILITOT, PROT, ALBUMIN in the last 168 hours. No results for input(s): LIPASE, AMYLASE in the last 168 hours. No results for input(s): AMMONIA in the last 168 hours.  CBC: Recent Labs  Lab 02/28/20 0514  WBC 13.2*  HGB 13.0  HCT 43.4  MCV 105.6*  PLT 124*    Cardiac Enzymes: No results for input(s): CKTOTAL, CKMB, CKMBINDEX, TROPONINI in the last 168 hours.  BNP (last 3 results) No results for input(s): BNP in the last 8760  hours.  ProBNP (last 3 results) No results for input(s): PROBNP in the last 8760 hours.  Radiological Exams: No results found.  Assessment/Plan Active Problems:   Acute on chronic respiratory failure with hypoxia (HCC)   COVID-19 virus infection   Acute arterial ischemic stroke, multifocal, multiple vascular territories [facility])   Seizure disorder (HCC)   Chronic systolic heart failure (HCC)   1. Acute on chronic respiratory failure hypoxia continue to wean on protocol goal is 2 hours on pressure support as tolerated. 2. COVID-19 virus infection treated clinically improving 3. Acute stroke no change 4. Seizure disorder no active seizure noted at this time continue to monitor 5. Chronic systolic heart failure compensated   I have personally seen and evaluated the patient, evaluated laboratory and imaging results, formulated the assessment and plan and placed orders. The Patient requires high complexity decision making with multiple systems involvement.  Rounds were done with the Respiratory Therapy Director and Staff therapists and discussed with nursing staff also.  Yevonne Pax, MD [facility] Pulmonary Critical Care Medicine Sleep Medicine

## 2020-03-21 DEATH — deceased

## 2021-06-28 IMAGING — CT CT ABD-PELV W/ CM
2 of 5 series · 16 of 46 positions shown, 18 images · IV contrast (Omni 300)
Comparison: June 03, 2018.

CLINICAL DATA: Abdominal abscess or infection.

EXAM:
CT ABDOMEN AND PELVIS WITH CONTRAST
TECHNIQUE: Multidetector CT imaging of the abdomen and pelvis was performed
using the standard protocol following bolus administration of
intravenous contrast.
CONTRAST:  100mL OMNIPAQUE IOHEXOL 300 MG/ML  SOLN

[Series 3: a/p w/ 5mm · axial · 0.88mm/px · z∈[+922,+1422]mm · 13 of 114 slices shown, 15 images]
[im 7/114  soft-tissue]
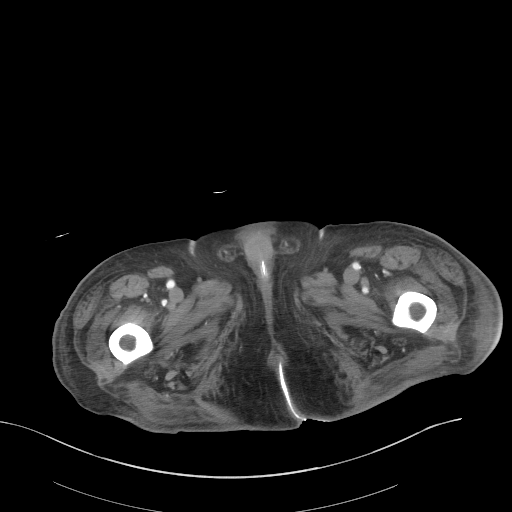
[im 7/114  bone]
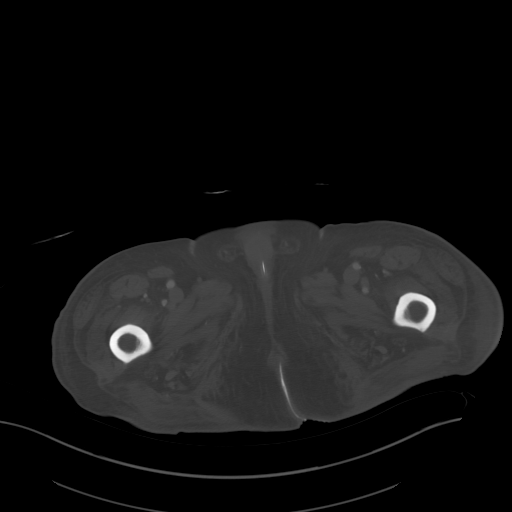
[im 13/114  soft-tissue]
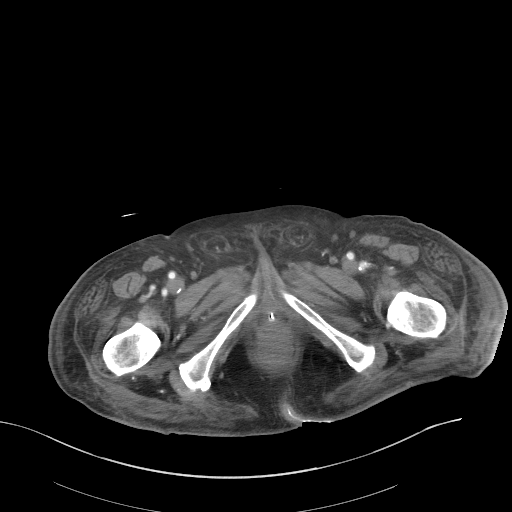
[im 26/114  soft-tissue]
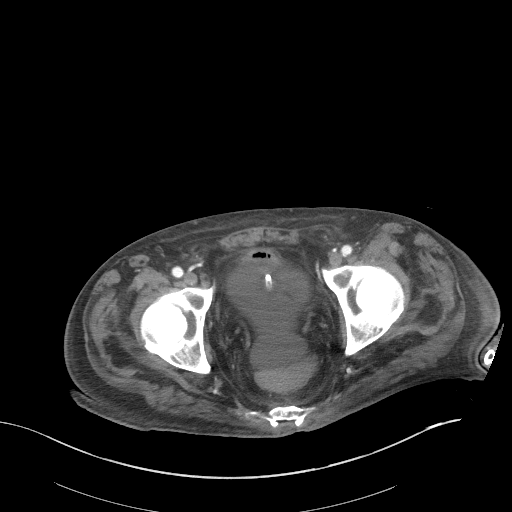
[im 32/114  soft-tissue]
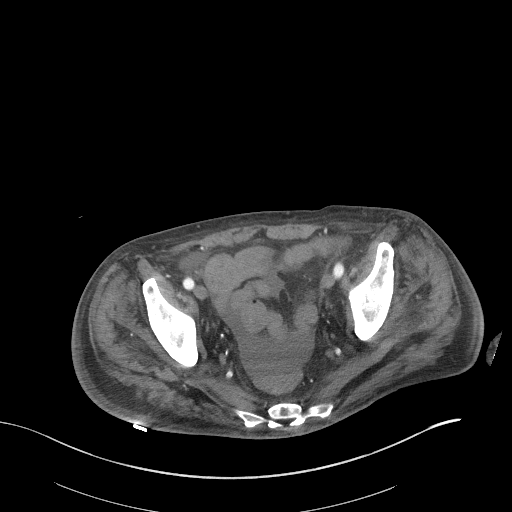
[im 38/114  soft-tissue]
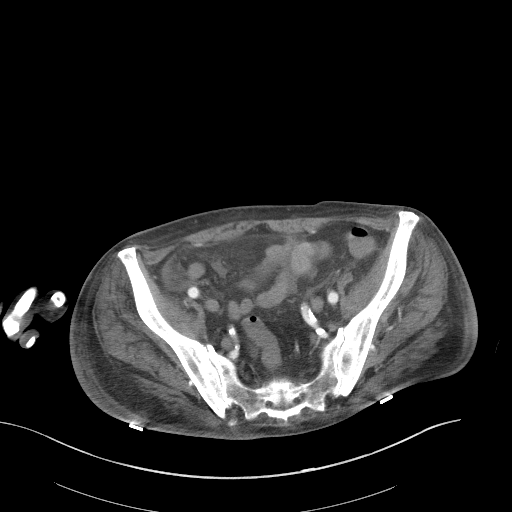
[im 51/114  soft-tissue]
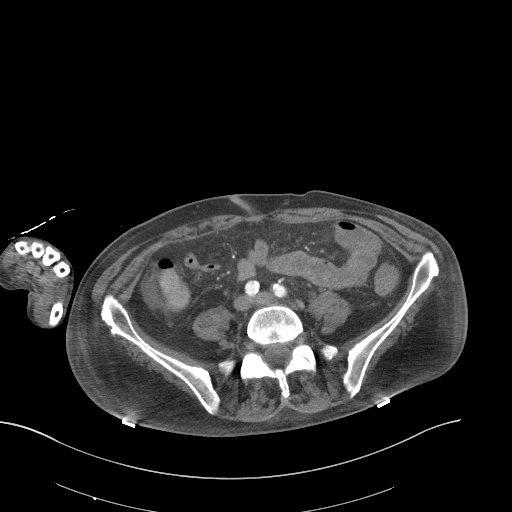
[im 57/114  soft-tissue]
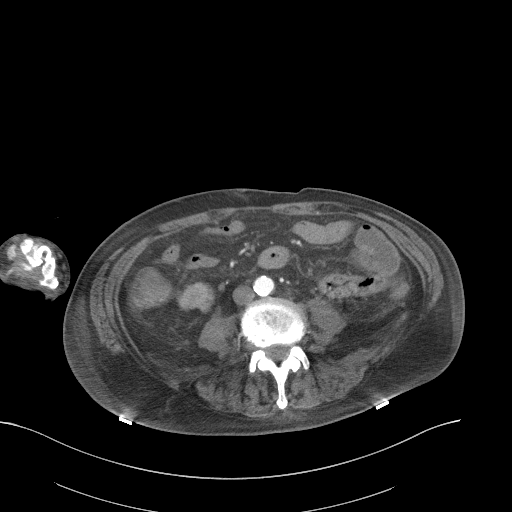
[im 63/114  soft-tissue]
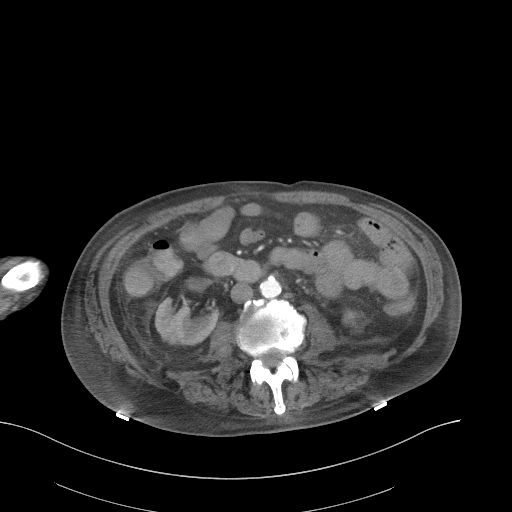
[im 76/114  soft-tissue]
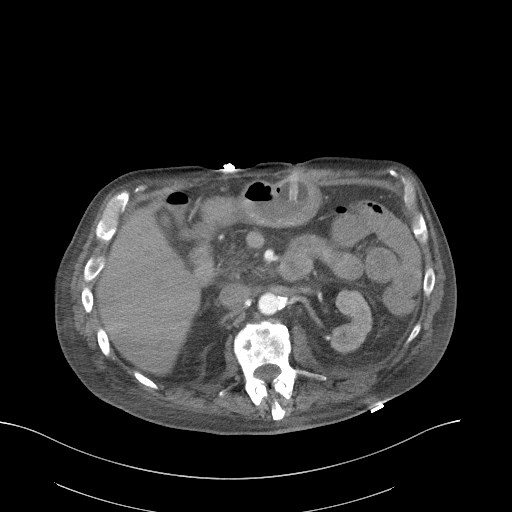
[im 76/114  bone]
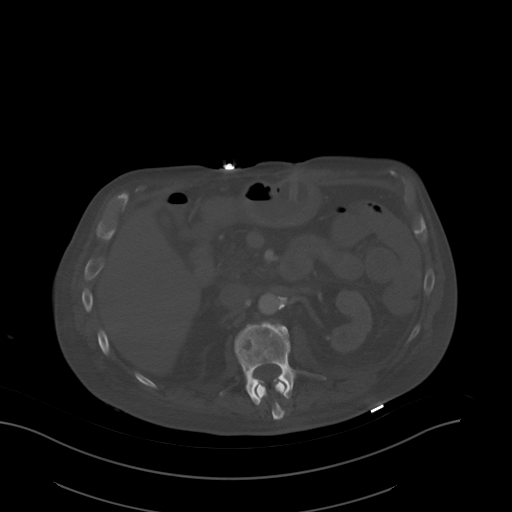
[im 82/114  soft-tissue]
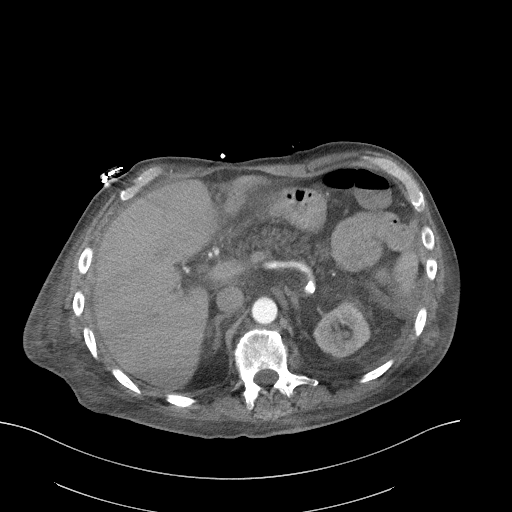
[im 88/114  soft-tissue]
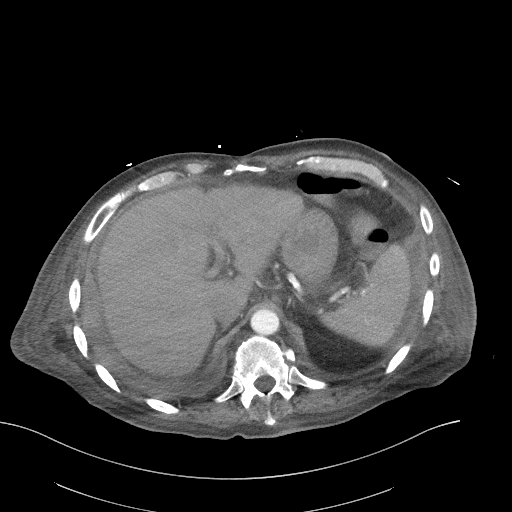
[im 101/114  soft-tissue]
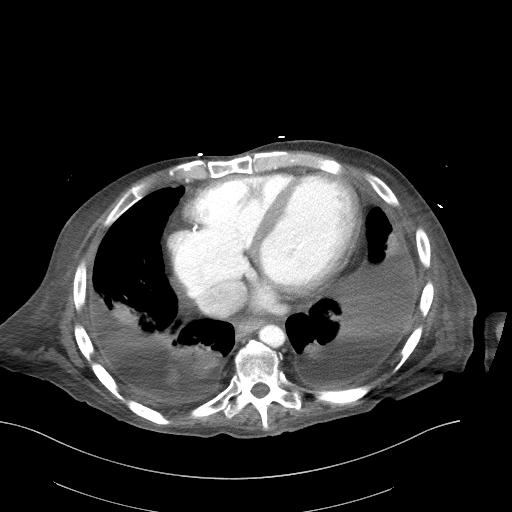
[im 107/114  soft-tissue]
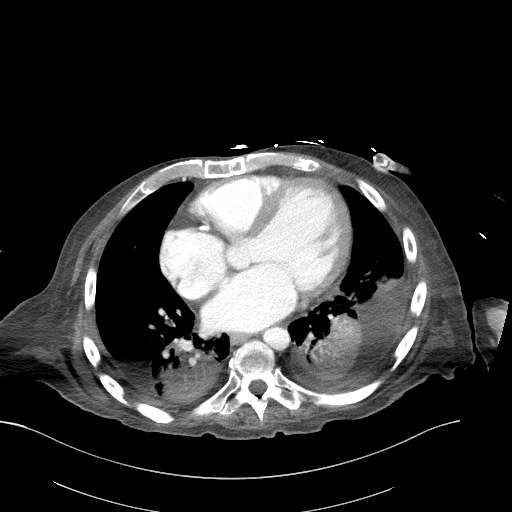

[Series 6: a/p w/ cor · coronal · 0.85mm/px · 3 of 151 slices shown]
[im 51/151  soft-tissue]
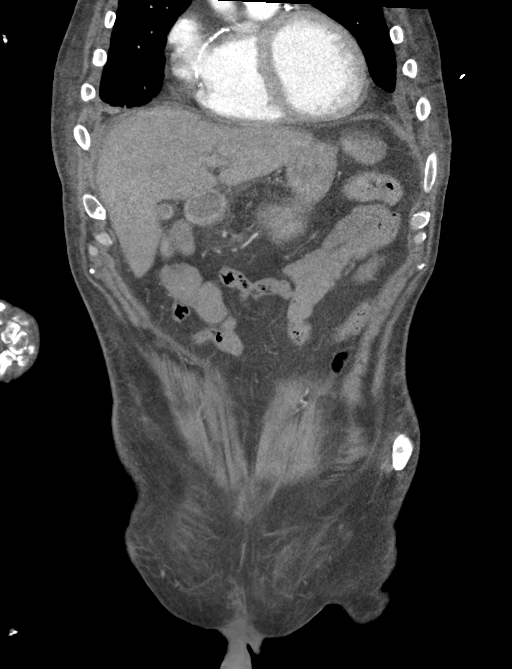
[im 67/151  soft-tissue]
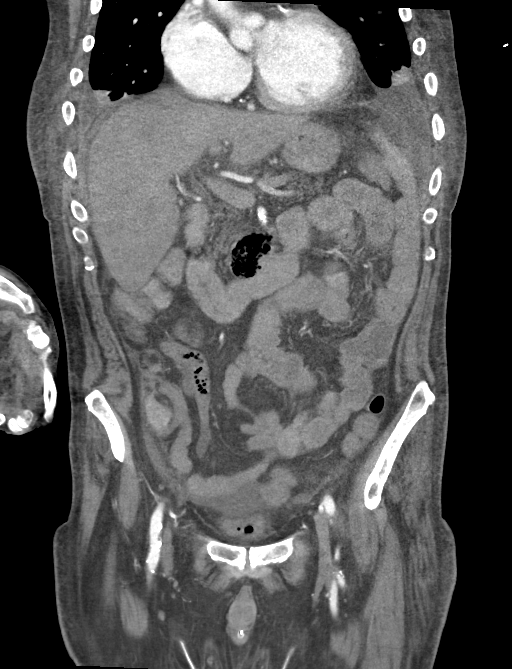
[im 84/151  soft-tissue]
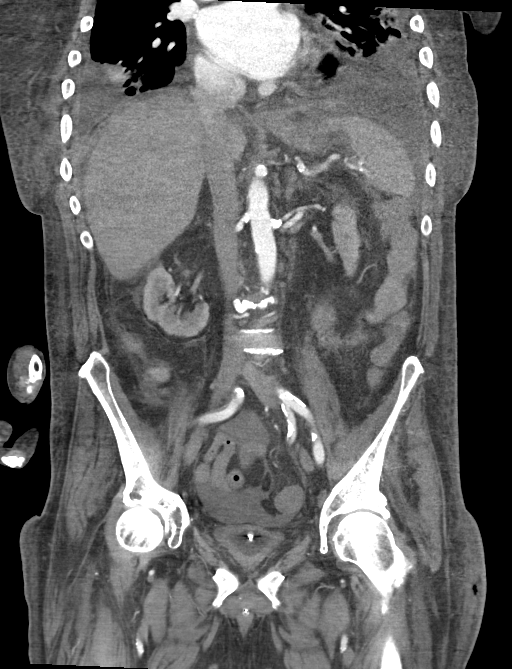

[16 of 46 positions shown; findings below may reference images not displayed]

FINDINGS: Lower chest: Small bilateral pleural effusions are noted with
adjacent atelectasis or infiltrates involving both lower lobes.

Hepatobiliary: No focal liver abnormality is seen. No gallstones,
gallbladder wall thickening, or biliary dilatation.

Pancreas: Unremarkable. No pancreatic ductal dilatation or
surrounding inflammatory changes.

Spleen: Normal in size without focal abnormality.

Adrenals/Urinary Tract: Adrenal glands appear normal. Duplicated
left ureter is noted. Simple cyst is seen in left lower pole. No
hydronephrosis or renal obstruction is noted. Urinary bladder is
decompressed secondary to Foley catheter.

Stomach/Bowel: Gastrostomy tube appears to be well position within
distal stomach. There is no evidence of bowel obstruction or
inflammation. The appendix is not clearly visualized, but no
inflammation is noted in the right lower quadrant.

Vascular/Lymphatic: Aortic atherosclerosis. No enlarged abdominal or
pelvic lymph nodes.

Reproductive: Prostate is unremarkable.

Other: Small amount of free fluid is noted in the pelvis.

Musculoskeletal: No acute or significant osseous findings.
IMPRESSION: 1. Small bilateral pleural effusions are noted with adjacent
atelectasis or infiltrates involving both lower lobes.
2. Gastrostomy tube appears to be well position within distal
stomach.
3. Small amount of free fluid is noted in the pelvis.
4. No other significant abnormality seen in the abdomen or pelvis.

Aortic Atherosclerosis ([id number]-BR4.4).
